# Patient Record
Sex: Male | Born: 1970 | Race: Black or African American | Hispanic: No | Marital: Single | Smoking: Never smoker
Health system: Southern US, Community
[De-identification: ages and names within clinical notes are randomized; demographics above are authoritative.]

## PROBLEM LIST (undated history)

## (undated) HISTORY — PX: OTHER SURGICAL HISTORY: SHX169

---

## 2001-03-20 ENCOUNTER — Emergency Department (HOSPITAL_COMMUNITY): Admission: EM | Admit: 2001-03-20 | Discharge: 2001-03-20 | Payer: Self-pay | Admitting: Emergency Medicine

## 2003-12-28 ENCOUNTER — Emergency Department (HOSPITAL_COMMUNITY): Admission: EM | Admit: 2003-12-28 | Discharge: 2003-12-28 | Payer: Self-pay

## 2006-01-30 ENCOUNTER — Emergency Department (HOSPITAL_COMMUNITY): Admission: EM | Admit: 2006-01-30 | Discharge: 2006-01-30 | Payer: Self-pay | Admitting: Emergency Medicine

## 2006-03-10 ENCOUNTER — Emergency Department (HOSPITAL_COMMUNITY): Admission: EM | Admit: 2006-03-10 | Discharge: 2006-03-10 | Payer: Self-pay | Admitting: Emergency Medicine

## 2006-10-08 ENCOUNTER — Emergency Department (HOSPITAL_COMMUNITY): Admission: EM | Admit: 2006-10-08 | Discharge: 2006-10-08 | Payer: Self-pay | Admitting: Emergency Medicine

## 2007-09-22 ENCOUNTER — Emergency Department (HOSPITAL_COMMUNITY): Admission: EM | Admit: 2007-09-22 | Discharge: 2007-09-22 | Payer: Self-pay | Admitting: Emergency Medicine

## 2007-11-27 ENCOUNTER — Emergency Department (HOSPITAL_COMMUNITY): Admission: EM | Admit: 2007-11-27 | Discharge: 2007-11-27 | Payer: Self-pay | Admitting: Emergency Medicine

## 2009-03-27 ENCOUNTER — Emergency Department (HOSPITAL_COMMUNITY): Admission: EM | Admit: 2009-03-27 | Discharge: 2009-03-27 | Payer: Self-pay | Admitting: Emergency Medicine

## 2009-07-19 ENCOUNTER — Emergency Department (HOSPITAL_COMMUNITY): Admission: EM | Admit: 2009-07-19 | Discharge: 2009-07-19 | Payer: Self-pay | Admitting: Emergency Medicine

## 2009-09-25 DIAGNOSIS — J869 Pyothorax without fistula: Secondary | ICD-10-CM

## 2009-09-25 HISTORY — PX: EMPYEMA DRAINAGE: SHX5097

## 2009-09-25 HISTORY — DX: Pyothorax without fistula: J86.9

## 2009-09-26 ENCOUNTER — Emergency Department (HOSPITAL_COMMUNITY): Admission: EM | Admit: 2009-09-26 | Discharge: 2009-09-26 | Payer: Self-pay | Admitting: Emergency Medicine

## 2009-09-27 ENCOUNTER — Ambulatory Visit: Payer: Self-pay | Admitting: Thoracic Surgery

## 2009-09-27 ENCOUNTER — Ambulatory Visit: Payer: Self-pay | Admitting: Pulmonary Disease

## 2009-09-27 ENCOUNTER — Inpatient Hospital Stay (HOSPITAL_COMMUNITY): Admission: EM | Admit: 2009-09-27 | Discharge: 2009-10-05 | Payer: Self-pay | Admitting: Emergency Medicine

## 2009-09-28 ENCOUNTER — Encounter: Payer: Self-pay | Admitting: Thoracic Surgery

## 2009-10-13 ENCOUNTER — Encounter: Admission: RE | Admit: 2009-10-13 | Discharge: 2009-10-13 | Payer: Self-pay | Admitting: Thoracic Surgery

## 2009-10-13 ENCOUNTER — Ambulatory Visit: Payer: Self-pay | Admitting: Thoracic Surgery

## 2009-10-14 ENCOUNTER — Encounter: Payer: Self-pay | Admitting: Physician Assistant

## 2009-10-14 ENCOUNTER — Ambulatory Visit: Payer: Self-pay | Admitting: Internal Medicine

## 2009-10-14 DIAGNOSIS — K029 Dental caries, unspecified: Secondary | ICD-10-CM | POA: Insufficient documentation

## 2009-10-14 DIAGNOSIS — J869 Pyothorax without fistula: Secondary | ICD-10-CM | POA: Insufficient documentation

## 2009-10-15 ENCOUNTER — Encounter: Payer: Self-pay | Admitting: Physician Assistant

## 2009-10-15 LAB — CONVERTED CEMR LAB
ALT: 19 units/L (ref 0–53)
AST: 18 units/L (ref 0–37)
Alkaline Phosphatase: 71 units/L (ref 39–117)
Basophils Relative: 0 % (ref 0–1)
CO2: 29 meq/L (ref 19–32)
Creatinine, Ser: 1.04 mg/dL (ref 0.40–1.50)
Eosinophils Absolute: 0.1 10*3/uL (ref 0.0–0.7)
Lymphs Abs: 3 10*3/uL (ref 0.7–4.0)
MCHC: 32.4 g/dL (ref 30.0–36.0)
MCV: 89.7 fL (ref 78.0–100.0)
Monocytes Relative: 6 % (ref 3–12)
Neutro Abs: 1.8 10*3/uL (ref 1.7–7.7)
Neutrophils Relative %: 34 % — ABNORMAL LOW (ref 43–77)
Platelets: 435 10*3/uL — ABNORMAL HIGH (ref 150–400)
RBC: 4.27 M/uL (ref 4.22–5.81)
Sodium: 140 meq/L (ref 135–145)
Total Bilirubin: 0.3 mg/dL (ref 0.3–1.2)
Total Protein: 6.9 g/dL (ref 6.0–8.3)
WBC: 5.2 10*3/uL (ref 4.0–10.5)

## 2009-11-16 ENCOUNTER — Encounter: Admission: RE | Admit: 2009-11-16 | Discharge: 2009-11-16 | Payer: Self-pay | Admitting: Thoracic Surgery

## 2009-11-16 ENCOUNTER — Ambulatory Visit: Payer: Self-pay | Admitting: Thoracic Surgery

## 2010-10-23 ENCOUNTER — Encounter: Payer: Self-pay | Admitting: Thoracic Surgery

## 2010-11-01 NOTE — Assessment & Plan Note (Signed)
Summary: XFU-EMPYEMA//DS   Vital Signs:  Patient profile:   40 year old male Height:      62 inches Weight:      156 pounds BMI:     28.64 Temp:     97.7 degrees F oral Pulse rate:   80 / minute Pulse rhythm:   regular Resp:     18 per minute BP sitting:   109 / 74  (left arm) Cuff size:   regular  Vitals Entered By: [place] Shannon (October 14, 2009 2:56 PM) CC: xfu...Marland KitchenMarland Kitchen pt has lung provider...Marland KitchenMarland Kitchen pt is concerned about back teeth... Is Patient Diabetic? No Pain Assessment Patient in pain? no       Does patient need assistance? Functional Status Self care Ambulation Normal   CC:  xfu...Marland KitchenMarland Kitchen pt has lung provider...Marland KitchenMarland Kitchen pt is concerned about back teeth....  History of Present Illness: New patient. Lived in [place] for a while and moved back to [place].   Notes he went to [place] in late 90s. Recently admitted to [facility] with left empyema.  He underwent drainage by Dr. Edwyna Shell and was d/c on 10/04/2009.  He f/u with Dr. Edwyna Shell yest and was taken off antibx.  All cultures in the hospital were neg.  He had HIV Ab reactive, but confirmatory tests were negative. UDS in hosp. clean. Feeling better.  NO chest pain, shortness of breath, fevers or chills.  Denies cough. Has a couple of teeth on bottom bilat that are bothersome.  Has not seen a dentist in a while.  Habits & Providers  Alcohol-Tobacco-Diet     Tobacco Status: never  Exercise-Depression-Behavior     Drug Use: no  Current Medications (verified): 1)  None  Allergies (verified): No Known Drug Allergies  Past History:  Past Medical History: Empyema 09/2009   a.  s/p drainage and decortication  Past Surgical History: s/p empyema drainage and decortication  Family History: unremarkable  Social History: Occupation: unemployed; previously sold cars Single 1 son Never Smoked Alcohol use-no Drug use-no Occupation:  employed Smoking Status:  never Drug Use:  no  Review of Systems  The patient denies chest pain,  syncope, melena, hematochezia, severe indigestion/heartburn, and hematuria.    Physical Exam  General:  alert, well-developed, and well-nourished.   Head:  normocephalic and atraumatic.   Eyes:  pupils equal, pupils round, and pupils reactive to light.   Ears:  R ear normal and L ear normal.   Nose:  no external deformity.   Mouth:  poor dentition.   Neck:  supple and no thyromegaly.   Lungs:  decreased breath sounds on the left no rales no egophony no wheezes  Heart:  normal rate, regular rhythm, and no murmur.   Abdomen:  soft, non-tender, and normal bowel sounds.   Msk:  normal ROM.   Neurologic:  alert & oriented X3 and cranial nerves II-XII intact.   Psych:  normally interactive.     Impression & Recommendations:  Problem # 1:  EMPYEMA (ICD-510.9) recovering well f/u with Dr. Edwyna Shell as directed  Problem # 2:  PREVENTIVE HEALTH CARE (ICD-V70.0)  check baseline labs UDS and HIV done in hosp  Orders: T-Comprehensive Metabolic Panel [phone]) T-CBC w/Diff (09811-91478) T-Syphilis Test (RPR) (29562-13086) T-TSH (57846-96295)  Problem # 3:  DENTAL CARIES (ICD-521.00)  refer to dental has at least one visible cavity on right lower molar  Orders: Dental Referral (Dentist)  Patient Instructions: 1)  Please schedule a follow-up appointment in 2 months with Gavinn Collard for CPE.  Appended Document: XFU-EMPYEMA//DS Patient: Ian Jenkins Note: All result statuses are Final unless otherwise noted.  Tests: (1) CBC with Diff (10010)   WBC                       5.2 K/uL                    4.0-10.5   RBC                       4.27 MIL/uL                 4.22-5.81   Hemoglobin           [L]  12.4 g/dL                   60.4-54.0   Hematocrit           [L]  38.3 %                      39.0-52.0   MCV                       89.7 fL                     78.0-100.0   MCHC                      32.4 g/dL                   98.1-19.1   RDW                       13.4 %                       11.5-15.5   Platelet Count       [H]  435 K/uL                    150-400   Granulocyte %        [L]  34 %                        43-77   Absolute Gran             1.8 K/uL                    1.7-7.7   Lymph %              [H]  57 %                        12-46   Absolute Lymph            3.0 K/uL                    0.7-4.0   Mono %                    6 %                         3-12   Absolute Mono             0.3 K/uL  0.1-1.0   Eos %                     2 %                         0-5   Absolute Eos              0.1 K/uL                    0.0-0.7   Baso %                    0 %                         0-1   Absolute Baso             0.0 K/uL                    0.0-0.1   WBC Morphology       RESULT: Criteria for review not met   RBC Morphology       RESULT: Criteria for review not met   Smear Review       RESULT: Criteria for review not met  Tests: (2) Comprehensive Metabolic Panel (33295)   Sodium                    140 mEq/L                   135-145   Potassium                 4.5 mEq/L                   3.5-5.3   Chloride                  103 mEq/L                   96-112   CO2                       29 mEq/L                    19-32   Glucose                   78 mg/dL                    18-84   BUN                       11 mg/dL                    1-66   Creatinine                1.04 mg/dL                  0.40-1.50   Bilirubin, Total          0.3 mg/dL                   0.6-3.0   Alkaline Phosphatase      71 U/L                      39-117  AST/SGOT                  18 U/L                      0-37   ALT/SGPT                  19 U/L                      0-53   Total Protein             6.9 g/dL                    2.5-0.5   Albumin                   3.5 g/dL                    3.9-7.6   Calcium                   8.9 mg/dL                   7.3-41.9  Tests: (3) TSH (23280)   TSH                       0.723 uIU/mL                0.350-4.500      ***Test methodology is 3rd generation TSH***  Tests: (4) RPR Reflex to T.pallidum Ab, Total (23940)   RPR                       NON REAC                    NON REAC  Note: An exclamation mark (!) indicates a result that was not dispersed into the flowsheet. Document Creation Date: 10/15/2009 3:07 AM _______________________________________________________________________  (1) Order result status: Final Collection or observation date-time: 10/14/2009 21:18 Requested date-time: 10/14/2009 16:04 Receipt date-time: 10/14/2009 21:18 Reported date-time: 10/15/2009 03:06 Referring Physician:   Ordering Physician:  Alben Spittle [phone]) Specimen Source:  Source: Lajean Silvius Order Number: [id number] Lab site: [facility]     [address]     [place]  [place]  [address]  (2) Order result status: Final Collection or observation date-time: 10/14/2009 21:18 Requested date-time: 10/14/2009 16:04 Receipt date-time: 10/14/2009 21:18 Reported date-time: 10/15/2009 03:06 Referring Physician:   Ordering Physician:  Alben Spittle [phone]) Specimen Source:  Source: Lajean Silvius Order Number: [id number] Lab site: [facility]     [address], [address]     [place]  [place]  [address]  (3) Order result status: Final Collection or observation date-time: 10/14/2009 21:18 Requested date-time: 10/14/2009 16:04 Receipt date-time: 10/14/2009 21:18 Reported date-time: 10/15/2009 03:06 Referring Physician:   Ordering Physician:  Alben Spittle [phone]) Specimen Source:  Source: Lajean Silvius Order Number: [id number] Lab site: [facility]     [address]     [place]  [place]  [address]  (4) Order result status: Final Collection or observation date-time: 10/14/2009 21:18 Requested date-time: 10/14/2009 16:04 Receipt date-time: 10/14/2009 21:18 Reported date-time: 10/15/2009 03:06 Referring Physician:     Ordering Physician:  Alben Spittle [phone]) Specimen Source:  Source: Lajean Silvius Order Number: [id number] Lab site: [facility]  [address]     [place]  [place]  [address]   Signed by Tereso Newcomer PA-C on 10/15/2009 at 2:49 PM  ________________________________________________________________________ [facility] since d/c from hosp all stable    Signed by Tereso Newcomer PA-C on 10/15/2009 at 2:49 PM

## 2010-11-01 NOTE — Letter (Signed)
Summary: *HSN Results Follow up  [facility]  [address] [place], [place] [address]   Phone: [phone]  Fax: [phone]      10/15/2009   NOAL ABSHIER [address] [place], [place]  [address]   Dear  Mr. MUADH CREASY,                            ____S.Drinkard,FNP   ____D. Gore,FNP       ____B. McPherson,MD   ____V. Rankins,MD    ____E. Mulberry,MD    ____N. Daphine Deutscher, FNP  ____D. Reche Dixon, MD    ____K. Philipp Deputy, MD    __x__S. Alben Spittle, PA-C     This letter is to inform you that your recent test(s):  _______Pap Smear    ____x___Lab Test     _______X-ray    ___x____ is within acceptable limits  _______ requires a medication change  _______ requires a follow-up lab visit  _______ requires a follow-up visit with your provider   Comments:       _________________________________________________________ If you have any questions, please contact our office                     Sincerely,  Tereso Newcomer PA-C [facility]

## 2010-11-12 ENCOUNTER — Observation Stay (HOSPITAL_COMMUNITY)
Admission: EM | Admit: 2010-11-12 | Discharge: 2010-11-15 | Disposition: A | Discharge status: Home / Self Care | Payer: Self-pay | Attending: Internal Medicine | Admitting: Internal Medicine

## 2010-11-12 ENCOUNTER — Emergency Department (HOSPITAL_COMMUNITY): Payer: Self-pay

## 2010-11-12 DIAGNOSIS — K036 Deposits [accretions] on teeth: Secondary | ICD-10-CM | POA: Insufficient documentation

## 2010-11-12 DIAGNOSIS — Z7901 Long term (current) use of anticoagulants: Secondary | ICD-10-CM | POA: Insufficient documentation

## 2010-11-12 DIAGNOSIS — K045 Chronic apical periodontitis: Secondary | ICD-10-CM | POA: Insufficient documentation

## 2010-11-12 DIAGNOSIS — M279 Disease of jaws, unspecified: Principal | ICD-10-CM | POA: Insufficient documentation

## 2010-11-12 DIAGNOSIS — K089 Disorder of teeth and supporting structures, unspecified: Secondary | ICD-10-CM | POA: Insufficient documentation

## 2010-11-12 LAB — CBC
HCT: 41.5 % (ref 39.0–52.0)
MCH: 30.7 pg (ref 26.0–34.0)
MCV: 85.6 fL (ref 78.0–100.0)
RDW: 12.1 % (ref 11.5–15.5)
WBC: 5.5 10*3/uL (ref 4.0–10.5)

## 2010-11-12 LAB — DIFFERENTIAL
Eosinophils Absolute: 0 10*3/uL (ref 0.0–0.7)
Eosinophils Relative: 1 % (ref 0–5)
Lymphocytes Relative: 55 % — ABNORMAL HIGH (ref 12–46)
Lymphs Abs: 3 10*3/uL (ref 0.7–4.0)
Monocytes Absolute: 0.3 10*3/uL (ref 0.1–1.0)
Monocytes Relative: 6 % (ref 3–12)

## 2010-11-12 LAB — COMPREHENSIVE METABOLIC PANEL
ALT: 14 U/L (ref 0–53)
Alkaline Phosphatase: 59 U/L (ref 39–117)
BUN: 11 mg/dL (ref 6–23)
CO2: 31 mEq/L (ref 19–32)
Calcium: 9.2 mg/dL (ref 8.4–10.5)
GFR calc non Af Amer: >60 mL/min (ref >60)
Glucose, Bld: 77 mg/dL (ref 70–99)
Sodium: 138 mEq/L (ref 135–145)

## 2010-11-12 LAB — POCT I-STAT, CHEM 8
BUN: 14 mg/dL (ref 6–23)
Calcium, Ion: 1.21 mmol/L (ref 1.12–1.32)
Chloride: 102 mEq/L (ref 96–112)
Creatinine, Ser: 1.3 mg/dL (ref 0.4–1.5)
Glucose, Bld: 76 mg/dL (ref 70–99)
HCT: 47 % (ref 39.0–52.0)
Potassium: 4.1 mEq/L (ref 3.5–5.1)

## 2010-11-12 LAB — APTT: aPTT: 30 seconds (ref 24–37)

## 2010-11-12 LAB — PROTIME-INR: Prothrombin Time: 13.6 seconds (ref 11.6–15.2)

## 2010-11-12 MED ORDER — IOHEXOL 300 MG/ML  SOLN
100.0000 mL | Freq: Once | INTRAMUSCULAR | Status: AC | PRN
  Administered 2010-11-12: 100 mL via INTRAVENOUS

## 2010-11-13 LAB — COMPREHENSIVE METABOLIC PANEL
ALT: 13 U/L (ref 0–53)
BUN: 10 mg/dL (ref 6–23)
CO2: 29 mEq/L (ref 19–32)
Calcium: 9.2 mg/dL (ref 8.4–10.5)
Creatinine, Ser: 1.12 mg/dL (ref 0.4–1.5)
GFR calc non Af Amer: >60 mL/min (ref >60)
Glucose, Bld: 79 mg/dL (ref 70–99)

## 2010-11-13 LAB — DIFFERENTIAL
Lymphs Abs: 3.1 10*3/uL (ref 0.7–4.0)
Monocytes Absolute: 0.4 10*3/uL (ref 0.1–1.0)
Monocytes Relative: 7 % (ref 3–12)
Neutro Abs: 1.6 10*3/uL — ABNORMAL LOW (ref 1.7–7.7)
Neutrophils Relative %: 31 % — ABNORMAL LOW (ref 43–77)

## 2010-11-13 LAB — CBC
Hemoglobin: 14.6 g/dL (ref 13.0–17.0)
MCH: 30.4 pg (ref 26.0–34.0)
MCV: 85.6 fL (ref 78.0–100.0)
RBC: 4.8 MIL/uL (ref 4.22–5.81)

## 2010-11-13 LAB — PHOSPHORUS: Phosphorus: 4 mg/dL (ref 2.3–4.6)

## 2010-11-13 LAB — MAGNESIUM: Magnesium: 2 mg/dL (ref 1.5–2.5)

## 2010-11-14 ENCOUNTER — Observation Stay (HOSPITAL_COMMUNITY): Payer: Self-pay

## 2010-11-15 ENCOUNTER — Other Ambulatory Visit (HOSPITAL_COMMUNITY): Payer: Self-pay | Admitting: Dentistry

## 2010-11-15 DIAGNOSIS — K036 Deposits [accretions] on teeth: Secondary | ICD-10-CM

## 2010-11-15 DIAGNOSIS — K0530 Chronic periodontitis, unspecified: Secondary | ICD-10-CM

## 2010-11-15 DIAGNOSIS — R220 Localized swelling, mass and lump, head: Secondary | ICD-10-CM

## 2010-11-15 DIAGNOSIS — R221 Localized swelling, mass and lump, neck: Secondary | ICD-10-CM

## 2010-11-15 DIAGNOSIS — K045 Chronic apical periodontitis: Secondary | ICD-10-CM

## 2010-11-16 NOTE — Op Note (Signed)
NAMELAMAJ, METOYER               ACCOUNT NO.:  [id number]  MEDICAL RECORD NO.:  [id number]           PATIENT TYPE:  I  LOCATION:  1521                         FACILITY:  [facility]  PHYSICIAN:  Charlynne Pander, D.D.S.DATE OF BIRTH:  1971/09/22  DATE OF PROCEDURE:   11/15/2010 DATE OF DISCHARGE: 11/15/2010                              OPERATIVE REPORT   PREOPERATIVE DIAGNOSES: 1. Left submandibular swelling. 2. Apical periodontitis. 3. Chronic periodontitis. 4. Tooth mobility.  POSTOPERATIVE DIAGNOSES: 1. Left submandibular swelling. 2. Apical periodontitis. 3. Chronic periodontitis. 4. Tooth mobility.  OPERATION: 1. Extraction of tooth numbers 18 and 31 with alveoloplasty. 2. Biopsy of soft tissue in the area extraction sites numbers 18 and     31 with submission to Pathology.  SURGEON:  Charlynne Pander, D.D.S.  ASSISTANT:  Public house manager Event organiser).  ANESTHESIA:  Local anesthesia only.  MEDICATIONS: 1. Unasyn IV antibiotic therapy as per previous orders. 2. Local anesthesia with total utilization of three carpules each     containing 36 mg of Xylocaine and 0.018 mg of epinephrine.  SPECIMENS: 1. Tooth and soft tissue associated with tooth #18. 2. Tooth and soft tissue associated tooth number 31    (pathology pending)  DRAINS:  None.  CULTURES:  None.  COMPLICATIONS:  None.  ESTIMATED BLOOD LOSS:  Minimal.  FLUIDS:  None.  INDICATIONS:  The patient was recently admitted with history of left facial and submandibular swelling.  Dental consultation requested to evaluate dental etiology and provide treatment as indicated.  The patient is examined and treatment planned for extraction of tooth numbers 18 and 31 with alveoloplasty as well as submission of soft tissue associated with the areas of numbers 18 and 31.  This treatment plan was formulated to decrease the risk and complication of dental infection from further at affecting the patient's  systemic health.  OPERATIVE FINDINGS:  The patient was examined in the Dental Medicine Clinic.  Tooth numbers 18 and 31 were identified for extraction.  The patient noted be affected by history of left submandibular swelling, periapical pathology in the area of numbers 18 and 31 as well as tooth mobility and chronic periodontitis associated with these teeth.  DESCRIPTION OF PROCEDURE:  The patient presented to the Dental Medicine Clinic.  All risks, benefits, and complications of the various procedures were explained to the patient.  The patient agreed to proceed with extraction of tooth numbers 18 and 31 with alveoloplasty as indicated along with submission of soft tissue areas numbers 18 and 31 for pathology evaluation.  An operative permit was signed and witnessed. The patient was then prepped and draped in usual manner for dental medicine procedure.  A time-out was performed.  The patient was identified and procedures were verified.  At this point of time, local anesthesia was administered with a total utilization of three carpules each containing 36 mg of Xylocaine with 0.018 mg of epinephrine.  The mandibular left and right quadrants were approached.  The patient was given bilateral inferior alveolar nerve blocks and long buccal nerve blocks utilizing the Xylocaine with epinephrine.  Further infiltration was then achieved utilizing lidocaine  with epinephrine.  This point in time, tooth #18 was approached.  The tooth was subluxated with a series straight elevators.  Tooth #18 was then removed with a 23 forceps without complication.  The extraction socket was then curetted and soft tissue along with tooth #18 was submitted to pathology to further evaluate the area for other pathology.  At this point in time, further alveoloplasty was then performed utilizing rongeurs and bone file.  The surgical site was then irrigated with copious amounts of sterile saline. The surgical site was  then closed from the mesial of #17 extended to the distal #19 utilizing 3-0 chromic gut suture in a continuous interrupted suture technique x1.  At this point in time, the mandibular right quadrant was approached. Tooth number 31 was then removed and 23 forceps without complication. Tooth #31 and soft tissue in the area of #31 was then submitted to Pathology for further evaluation.  The socket was then curetted and compressed appropriately.  Alveoplasty was then performed utilizing rongeurs and bone file.  Surgical site was then irrigated with copious amounts of sterile saline.  The surgical site was then closed from the mesial of #32 and extended the distal #30 utilizing 3-0 chromic gut suture in a continuous interrupted suture technique x1.  A series of 4x4 gauze were placed in the mouth to aid hemostasis.  The patient was then placed in the upright position.  Postoperative vitals were obtained and recorded appropriately.  The patient was then provided postop instructions written and verbally.  The patient will utilize pain medication of Percocet 5/325 1-2 tablets every 4-6 hours as needed for pain.  The patient will return approximately in a week for evaluation for suture removal.  Ultimate disposition of the patient as far as discharge will be determined by the hospitalist at this time. All counts were correct for dental medicine procedure.  The patient was then dismissed to the care of the hospital attendant caregiver for 5100.     Charlynne Pander, D.D.S.     RFK/MEDQ  D:  11/15/2010  T:  11/15/2010  Job:  [id number]  cc:   Kathlen Mody, MD  Electronically Signed by Cindra Eves D.D.S. on 11/16/2010 09:01:14 AM

## 2010-11-16 NOTE — Consult Note (Signed)
NAMEFRAN, MCREE               ACCOUNT NO.:  [id number]  MEDICAL RECORD NO.:  [id number]           PATIENT TYPE:  I  LOCATION:  1521                         FACILITY:  [facility]  PHYSICIAN:  Charlynne Pander, D.D.S.DATE OF BIRTH:  Sep 02, 1971  DATE OF CONSULTATION:  11/15/2010 DATE OF DISCHARGE: 11/15/2010                                CONSULTATION   REFERRING PHYSICIAN:  Kathlen Mody, MD  HISTORY:  Ian Jenkins is a 40 year old male, referred by Dr. Blake Divine for dental consultation.  The patient was recently admitted with a history of left facial swelling.  Dental consultation requested to evaluate dental etiology and to provide dental treatment as indicated.  PAST MEDICAL HISTORY: 1. History of left jaw swelling, reason for this admission.     a.     Current IV antibiotic therapy with Unasyn. 2. History of empyema in December of 2010.     a.     Status post VATS procedure with mini thoracotomy,      decortication and drainage of the empyema by Dr. Karle Plumber on      September 28, 2009.  ALLERGIES:  SCALLOPS.  MEDICATIONS: 1. Unasyn IV every 6 hours. 2. Lovenox 40 mg subcutaneously every 24 hours.  SOCIAL HISTORY:  The patient works in [employer] se[employer].  The patient previously was a Community education officer.  The patient is a nonsmoker, nondrinker, and does not use IV drugs.  FAMILY HISTORY:  Father died at the age of 108 from heart attack and had a history of coronary artery disease, congestive heart failure.  Mother is otherwise healthy.  FUNCTION ASSESSMENT:  The patient was independent for ADLs prior to this admission.  REVIEW OF SYSTEMS:  Reviewed from the chart for this admission.  DENTAL HISTORY:  CHIEF COMPLAINT:  Dental consultation requested to evaluate the patient for history of left facial swelling.  HISTORY OF PRESENT ILLNESS:  The patient gives a history of having left jaw and facial swelling for approximately 1 month.  The patient indicates that the  swelling worsened to the point where he needed to seek evaluation at the emergency room.  The patient was subsequently admitted, placed on IV antibiotic therapy.  The patient indicates that the area hurts somewhat and is "sore."  The patient indicates that when it hurts, it hurts at intensity of 2-3/10.  The patient indicates it is approximately 1-2/10 today.  The patient indicates that the IV antibiotic therapy has helped with some of the swelling, but has not removed all swelling.  The patient points to tooth #18 and 31 as the offending teeth that hurt for him from time to time.  The patient last saw a dentist "a long time ago."  The patient indicates that he was in his "teens."  The patient has not sought regular dental care since his childhood and does not have a regular primary dentist.  PHYSICAL EXAMINATION:  GENERAL:  The patient is a well-developed, well- nourished male in no acute distress. VITAL SIGNS:  Blood pressure is 111/71, pulse rate is 64, respirations 17, temperature 99.3. HEENT:  Patient with left submandibular swelling with some slight swelling  in the right side as well.  The patient denies acute TMJ symptoms.  The patient does not appear to have any symptoms of trismus or difficulty swallowing. DENTITION:  The patient with an intact dentition. INTRAORAL EXAM:  The patient with normal saliva.  I do not see any evidence of intraoral abscess formation.  The patient does have a fistulous tract on the lingual aspect of tooth #18. DENTAL CARIES:  The patient has several areas of dental caries.  The patient would need a full series of dental radiographs to identify the extent of the dental caries. ENDODONTIC:  Patient with multiple areas of periapical pathology and evidence of a fistulous tract on the mandibular lingual area #18.  The patient has not had any previous root canal therapies. CROWN OR BRIDGE:  There are no crown or bridge restorations. PROSTHODONTIC:  No  history of partial dentures at this time. OCCLUSION:  The patient with a stable occlusion, but a poor occlusal scheme.  RADIOGRAPHIC INTERPRETATION:  Panoramic x-ray was taken on November 14, 2010, by the department of radiology.  There are no missing teeth.  The patient has all remaining wisdom teeth present.  The patient has periapical pathology associated with apices of tooth #18 and #31 at this time.  The patient appears to have several areas of dental caries.  I would need a full series of dental radiographs to identify the extent of the dental caries.  ASSESSMENT: 1. Left submandibular swelling. 2. Periapical pathology at the apices of tooth #18 and #31. 3. Chronic periodontitis with bone loss. 4. Plaque and calculus accumulations. 5. Tooth mobility. 6. History of oral neglect. 7. Stable occlusion, but a poor occlusal scheme. 8. Current Lovenox therapy with risk for bleeding with invasive dental     procedures.  PLAN/RECOMMENDATIONS:   1. I discussed the risks, benefits, complications, and various treatment options with the patient in relationship to his medical and dental conditions.  We discussed various treatment options to include no treatment, selective extraction of tooth #18 and #31 at this time with alveoplasty as indicated, submission of periapical contents to Pathology to rule out other pathology, need for followup with a primary dentist of his choice for an exam, x-rays, and treatment planning most likely to include additional extractions of wisdom teeth, periodontal therapy, dental restorations, possible root canal therapy, crown or bridge therapy, implant therapy, and replacing missing teeth as indicated after adequate healing.  The patient agrees to proceed with selective extraction of tooth #18 and #31 at this time in the hospital  dental clinic today at 10:30 a.m.  The patient will then follow up with  a dentist of his choice for additional evaluation and  care as indicated.  2. Discussion of findings with Dr. Blake Divine and assistance in coordination of future care as indicated.     Charlynne Pander, D.D.S.     RFK/MEDQ  D:  11/15/2010  T:  11/15/2010  Job:  [id number]  cc:   Kathlen Mody, MD  Electronically Signed by Cindra Eves D.D.S. on 11/16/2010 09:02:44 AM

## 2010-11-22 NOTE — H&P (Signed)
NAMEJHALEN, ELEY               ACCOUNT NO.:  [id number]  MEDICAL RECORD NO.:  [id number]           PATIENT TYPE:  E  LOCATION:  WLED                         FACILITY:  [facility]  PHYSICIAN:  Kathlen Mody, MD       DATE OF BIRTH:  1970/11/05  DATE OF ADMISSION:  11/12/2010 DATE OF DISCHARGE:                             HISTORY & PHYSICAL   CHIEF COMPLAINT:  Left jaw swelling since 1 month.  HISTORY OF PRESENT ILLNESS:  Mr. Elting is a 40 year old gentleman who has a past medical history of empyema, status post VATS decortication came in complaining of left jaw swelling and mild soreness since 1 month.  The patient denies any fever or chills.  Denies shortness of breath, chest pain, syncope.  The patient denies any hoarseness of voice.  The patient denies any nausea, vomiting, abdominal pain, diarrhea.  Denies any urinary complaints.  Denies headache, blurry vision, tingling or numbness.  The patient also denies any oozing of blood from the mouth, etc.  REVIEW OF SYSTEMS:  See HPI, otherwise negative.  PAST MEDICAL HISTORY:  Empyema of the left chest, status post VATS.  FAMILY HISTORY:  Coronary artery disease in the father.  SOCIAL HISTORY:  The patient works in the financial services.  Denies smoking, EtOH or IV drug abuse.  MEDICATIONS:  None.  ALLERGIES:  No known drug allergies.  PHYSICAL EXAMINATION:  VITAL SIGNS:  Temperature of 98.2, pulse of 79 per minute, respiratory rate 20 per minute, blood pressure 141/82, saturating about 97% on room air.  GENERAL:  The patient is alert, afebrile, oriented x3, comfortable in no acute distress.  HEENT AND NECK:  Pupils equal and reactive to light.  Moist mucous membranes.  No deviation of the uvula.  No JVD.  CARDIOVASCULAR:  S1, S2 heard.  No rubs, murmurs or gallops.  Regular rate and rhythm.  RESPIRATORY:  Good air entry bilateral.  ABDOMEN:  Soft, nontender, nondistended.  Good bowel sounds.  EXTREMITIES:  No pedal edema.   NEUROLOGIC:  Nonfocal.  PERTINENT LABORATORY AND X-RAY DATA:  The patient had a CBC which showed a WBC count of 5.5, hemoglobin of 14.9, hematocrit of 41.5, platelets of 239.  Sodium of 139, potassium of 4.1, chloride 102, bicarb 28, glucose 76, BUN 14, creatinine 1.3.  The patient had a CT maxillofacial with contrast showing 3 x 3 x 2 cm left submandibular abscess superficially, extensive periapical disease involving the mandibular wisdom teeth bilaterally with destructive bone changes.  This likely communicates with the superficial abscess on the left but there is no direct connection.  ASSESSMENT AND PLAN: 1. This 40 year old gentleman came in for left jaw swelling.  He was     found to have a left submandibular abscess.  The patient is     afebrile.  No leukocytosis.  Oral surgery not available over the     weekend.  The patient will be started on IV Unasyn 3 g q.6 h.  At     this time, the patient was not complaining of any shortness of     breath, so will monitor him.  Pain medications as needed.  Will     consult Oral Surgery on Monday morning and get their     recommendations. 2. Regular diet. 3. The patient is ambulatory. 4. The patient is Full Code.          ______________________________ Kathlen Mody, MD     VA/MEDQ  D:  11/12/2010  T:  11/12/2010  Job:  [id number]  Electronically Signed by Kathlen Mody MD on 11/22/2010 10:33:18 PM

## 2010-11-24 ENCOUNTER — Ambulatory Visit (HOSPITAL_COMMUNITY): Payer: Self-pay | Admitting: Dentistry

## 2010-11-24 DIAGNOSIS — K08109 Complete loss of teeth, unspecified cause, unspecified class: Secondary | ICD-10-CM

## 2010-11-24 DIAGNOSIS — K08409 Partial loss of teeth, unspecified cause, unspecified class: Secondary | ICD-10-CM

## 2010-11-25 NOTE — Discharge Summary (Signed)
  Ian Jenkins, Ian Jenkins               ACCOUNT NO.:  [id number]  MEDICAL RECORD NO.:  [id number]           PATIENT TYPE:  I  LOCATION:  1521                         FAC[facility]  PHYSICIAN:  Kathlen Mody, MD       DATE OF BIRTH:  03-30-1971  DATE OF ADMISSION:  11/12/2010 DATE OF DISCHARGE:  11/15/2010                              DISCHARGE SUMMARY   DISCHARGE DIAGNOSES: 1. Left submandibular abscess. 2. Apical periodontitis, chronic periodontitis.  DISCHARGE MEDICATIONS: 1. Augmentin 1 tablet b.i.d. for about 7 days. 2. Percocet 1 tablet q.6-8 h. for 1 week.  PERTINENT LABORATORY DATA:  CBC was normal.  Chem-8 was normal. Magnesium was 2.1, phosphorus was 8.9.  CBC on the day of discharge showed his WBC count of 5.1, hemoglobin of 14.6, hematocrit of 41.8. CMP was within normal limits.  RADIOLOGY:  The patient had a CT maxillofacial with contrast, showed 2 to 3 cm left submandibular abscess superficially.  Extensive periapicaldisease involving the mandibular wisdom teeth bilateral with destructive bone changes, likely communicates with superficial abscess on the left. Odontogram done on October 31, 2010, showed well-defined lucency surrounding the roots of both right and left lower second molars, consider abscess versus odontogenic cyst.  CONSULTS CALLED:  Oral surgery consult/dental consult called.  PROCEDURES DONE:  The patient had extractions of tooth with alveoloplasty and biopsy of the soft tissue at the extraction site and sent to Pathology.  BRIEF HOSPITAL COURSE:  This 40 year old gentleman with past medical history of empyema, status post VATS decortication came in planning of left jaw swelling and sore throat since 1 month.  He was found to have a submandibular abscess and was started on IV Unasyn for the abscess. Oral surgery/dental consult was called.  He underwent extraction of the tooth and alveoloplasty and he was discharged on oral antibiotic, Augmentin 1  tablet twice a day for about 7 days to complete the course of antibiotics and pain medication.  PHYSICAL EXAMINATION:  GENERAL:  He was alert, afebrile, oriented x3, in no acute distress. VITAL SIGNS:  On the day of discharge, he had temperature of 97.9, pulse was 69, respiratory rate of 18 per minute, blood pressure 124/70, saturating about 97% on room air. CARDIOVASCULAR:  S1 and S2 heard. RESPIRATORY:  Good air entry bilaterally. ABDOMEN:  Soft, nontender, and nondistended.  Good bowel sounds. EXTREMITIES:  No pedal edema.  The patient was hemodynamically stable for discharge.  He was discharged on oral antibiotics and pain medication.  He was asked to follow with Dental and to follow with the pathology results.          ______________________________ Kathlen Mody, MD     VA/MEDQ  D:  11/23/2010  T:  11/24/2010  Job:  [id number]  Electronically Signed by Kathlen Mody MD on 11/25/2010 12:17:59 AM

## 2010-12-18 LAB — CBC
MCV: 90.1 fL (ref 78.0–100.0)
MCV: 90.9 fL (ref 78.0–100.0)
Platelets: 547 10*3/uL — ABNORMAL HIGH (ref 150–400)
RBC: 3.98 MIL/uL — ABNORMAL LOW (ref 4.22–5.81)
WBC: 10.2 10*3/uL (ref 4.0–10.5)
WBC: 7.6 10*3/uL (ref 4.0–10.5)

## 2010-12-18 LAB — BASIC METABOLIC PANEL
CO2: 28 mEq/L (ref 19–32)
Calcium: 9.2 mg/dL (ref 8.4–10.5)
Sodium: 134 mEq/L — ABNORMAL LOW (ref 135–145)

## 2011-01-02 LAB — POCT I-STAT 3, ART BLOOD GAS (G3+)
Bicarbonate: 28.6 mEq/L — ABNORMAL HIGH (ref 20.0–24.0)
Bicarbonate: 30.4 mEq/L — ABNORMAL HIGH (ref 20.0–24.0)
Patient temperature: 98.3
TCO2: 30 mmol/L (ref 0–100)
TCO2: 32 mmol/L (ref 0–100)
pCO2 arterial: 44.8 mmHg (ref 35.0–45.0)
pCO2 arterial: 47.7 mmHg — ABNORMAL HIGH (ref 35.0–45.0)
pH, Arterial: 7.412 (ref 7.350–7.450)
pH, Arterial: 7.413 (ref 7.350–7.450)
pO2, Arterial: 49 mmHg — ABNORMAL LOW (ref 80.0–100.0)
pO2, Arterial: 77 mmHg — ABNORMAL LOW (ref 80.0–100.0)

## 2011-01-02 LAB — DIFFERENTIAL
Eosinophils Absolute: 0 10*3/uL (ref 0.0–0.7)
Eosinophils Relative: 0 % (ref 0–5)
Lymphocytes Relative: 13 % (ref 12–46)
Lymphs Abs: 1.5 10*3/uL (ref 0.7–4.0)
Lymphs Abs: 1.7 10*3/uL (ref 0.7–4.0)
Monocytes Absolute: 1.4 10*3/uL — ABNORMAL HIGH (ref 0.1–1.0)
Monocytes Relative: 11 % (ref 3–12)
Monocytes Relative: 13 % — ABNORMAL HIGH (ref 3–12)
Neutrophils Relative %: 74 % (ref 43–77)

## 2011-01-02 LAB — FUNGUS CULTURE W SMEAR

## 2011-01-02 LAB — CBC
HCT: 30.7 % — ABNORMAL LOW (ref 39.0–52.0)
HCT: 34.2 % — ABNORMAL LOW (ref 39.0–52.0)
HCT: 37 % — ABNORMAL LOW (ref 39.0–52.0)
HCT: 37.1 % — ABNORMAL LOW (ref 39.0–52.0)
Hemoglobin: 10.8 g/dL — ABNORMAL LOW (ref 13.0–17.0)
Hemoglobin: 11.9 g/dL — ABNORMAL LOW (ref 13.0–17.0)
MCHC: 34.6 g/dL (ref 30.0–36.0)
MCV: 90 fL (ref 78.0–100.0)
MCV: 90.6 fL (ref 78.0–100.0)
MCV: 90.9 fL (ref 78.0–100.0)
Platelets: 304 10*3/uL (ref 150–400)
Platelets: 310 10*3/uL (ref 150–400)
Platelets: 317 10*3/uL (ref 150–400)
Platelets: 323 10*3/uL (ref 150–400)
Platelets: 345 10*3/uL (ref 150–400)
Platelets: 402 10*3/uL — ABNORMAL HIGH (ref 150–400)
RBC: 3.38 MIL/uL — ABNORMAL LOW (ref 4.22–5.81)
RBC: 3.57 MIL/uL — ABNORMAL LOW (ref 4.22–5.81)
RBC: 4.08 MIL/uL — ABNORMAL LOW (ref 4.22–5.81)
RBC: 4.12 MIL/uL — ABNORMAL LOW (ref 4.22–5.81)
RDW: 12.4 % (ref 11.5–15.5)
RDW: 12.6 % (ref 11.5–15.5)
RDW: 12.6 % (ref 11.5–15.5)
WBC: 11 10*3/uL — ABNORMAL HIGH (ref 4.0–10.5)
WBC: 11.4 10*3/uL — ABNORMAL HIGH (ref 4.0–10.5)
WBC: 11.9 10*3/uL — ABNORMAL HIGH (ref 4.0–10.5)
WBC: 12.2 10*3/uL — ABNORMAL HIGH (ref 4.0–10.5)
WBC: 13 10*3/uL — ABNORMAL HIGH (ref 4.0–10.5)

## 2011-01-02 LAB — BASIC METABOLIC PANEL
BUN: 12 mg/dL (ref 6–23)
BUN: 13 mg/dL (ref 6–23)
BUN: 13 mg/dL (ref 6–23)
BUN: 6 mg/dL (ref 6–23)
BUN: 8 mg/dL (ref 6–23)
Calcium: 8.6 mg/dL (ref 8.4–10.5)
Calcium: 8.6 mg/dL (ref 8.4–10.5)
Calcium: 8.7 mg/dL (ref 8.4–10.5)
Chloride: 94 mEq/L — ABNORMAL LOW (ref 96–112)
Creatinine, Ser: 0.88 mg/dL (ref 0.4–1.5)
GFR calc Af Amer: >60 mL/min (ref >60)
GFR calc Af Amer: >60 mL/min (ref >60)
GFR calc non Af Amer: 59 mL/min — ABNORMAL LOW (ref >60)
GFR calc non Af Amer: >60 mL/min (ref >60)
GFR calc non Af Amer: >60 mL/min (ref >60)
GFR calc non Af Amer: >60 mL/min (ref >60)
GFR calc non Af Amer: >60 mL/min (ref >60)
Glucose, Bld: 82 mg/dL (ref 70–99)
Glucose, Bld: 90 mg/dL (ref 70–99)
Glucose, Bld: 97 mg/dL (ref 70–99)
Potassium: 3.9 mEq/L (ref 3.5–5.1)
Potassium: 4.2 mEq/L (ref 3.5–5.1)
Potassium: 4.2 mEq/L (ref 3.5–5.1)
Sodium: 132 mEq/L — ABNORMAL LOW (ref 135–145)

## 2011-01-02 LAB — COMPREHENSIVE METABOLIC PANEL
AST: 45 U/L — ABNORMAL HIGH (ref 0–37)
Albumin: 2.1 g/dL — ABNORMAL LOW (ref 3.5–5.2)
Alkaline Phosphatase: 75 U/L (ref 39–117)
BUN: 8 mg/dL (ref 6–23)
CO2: 33 mEq/L — ABNORMAL HIGH (ref 19–32)
Chloride: 94 mEq/L — ABNORMAL LOW (ref 96–112)
Creatinine, Ser: 1.06 mg/dL (ref 0.4–1.5)
GFR calc non Af Amer: >60 mL/min (ref >60)
Potassium: 3.9 mEq/L (ref 3.5–5.1)
Total Bilirubin: 0.4 mg/dL (ref 0.3–1.2)

## 2011-01-02 LAB — URINALYSIS, ROUTINE W REFLEX MICROSCOPIC
Glucose, UA: NEGATIVE mg/dL
Leukocytes, UA: NEGATIVE
pH: 6 (ref 5.0–8.0)

## 2011-01-02 LAB — AFB CULTURE WITH SMEAR (NOT AT ARMC): Acid Fast Smear: NONE SEEN

## 2011-01-02 LAB — POCT I-STAT, CHEM 8
Calcium, Ion: 1.1 mmol/L — ABNORMAL LOW (ref 1.12–1.32)
Creatinine, Ser: 1.4 mg/dL (ref 0.4–1.5)
Glucose, Bld: 111 mg/dL — ABNORMAL HIGH (ref 70–99)
Hemoglobin: 12.9 g/dL — ABNORMAL LOW (ref 13.0–17.0)
TCO2: 28 mmol/L (ref 0–100)

## 2011-01-02 LAB — BODY FLUID CULTURE
Culture: NO GROWTH
Gram Stain: NONE SEEN

## 2011-01-02 LAB — URINE MICROSCOPIC-ADD ON

## 2011-01-02 LAB — RAPID URINE DRUG SCREEN, HOSP PERFORMED
Amphetamines: NOT DETECTED
Barbiturates: NOT DETECTED
Benzodiazepines: NOT DETECTED
Cocaine: NOT DETECTED
Opiates: NOT DETECTED

## 2011-01-02 LAB — TISSUE CULTURE

## 2011-01-02 LAB — ABO/RH: ABO/RH(D): O POS

## 2011-01-02 LAB — CROSSMATCH: ABO/RH(D): O POS

## 2011-01-02 LAB — MRSA PCR SCREENING: MRSA by PCR: NEGATIVE

## 2011-01-02 LAB — CULTURE, BLOOD (SINGLE)

## 2011-01-02 LAB — PROTIME-INR: INR: 1.18 (ref 0.00–1.49)

## 2011-01-02 LAB — CARDIAC PANEL(CRET KIN+CKTOT+MB+TROPI)
CK, MB: 1.2 ng/mL (ref 0.3–4.0)
CK, MB: 1.7 ng/mL (ref 0.3–4.0)
Relative Index: 0.7 (ref 0.0–2.5)
Relative Index: 0.8 (ref 0.0–2.5)
Total CK: 223 U/L (ref 7–232)
Total CK: 236 U/L — ABNORMAL HIGH (ref 7–232)
Troponin I: 0.04 ng/mL (ref 0.00–0.06)
Troponin I: 0.05 ng/mL (ref 0.00–0.06)

## 2011-02-14 NOTE — Assessment & Plan Note (Signed)
OFFICE VISIT   NESTER, BACHUS  DOB:  1971/03/31                                        October 13, 2009  CHART #:  [id number]   The patient returned for his first postoperative visit.  His blood  pressure was 115/68, pulse 78, respirations 18, and sats were 98%.  Chest x-ray showed normal postoperative changes and has marked decrease  in the pleural reaction on the left side.  We removed his chest tube  sutures.  His incisions are well healed.  I gradually told him to  increase his activity.  I gave him a refill for Percocet, #40.  I will  see him back again in 3 weeks with a chest x-ray.   Ines Bloomer, M.D.  Electronically Signed   DPB/MEDQ  D:  10/13/2009  T:  10/14/2009  Job:  [id number]

## 2011-02-17 NOTE — Assessment & Plan Note (Signed)
OFFICE VISIT   [name], [name]  DOB:  Feb 14, 1971                                        November 17, 2009  CHART #:  [id number]   This patient is came today for followup of his empyema.  Chest x-ray  shows a small effusion on the left side.  Blood pressure is 114/77,  pulse 100, respirations 18, sats were 98%.  His incisions are well  healed.  Lungs are clear to auscultation and percussion.  We will see  him back in 3 months with a chest x-ray for final check.   Ines Bloomer, M.D.  Electronically Signed   DPB/MEDQ  D:  11/17/2009  T:  11/18/2009  Job:  [id number]

## 2012-11-25 IMAGING — CT CT MAXILLOFACIAL W/ CM
4 of 5 series · 15 of 33 positions shown, 17 images · IV contrast (APPLIED)
Comparison: None

CLINICAL DATA: Left submandibular soft tissue swelling.

CT MAXILLOFACIAL WITH CONTRAST
TECHNIQUE: Multidetector CT imaging of the maxillofacial
structures was performed with intravenous contrast. Multiplanar CT
image reconstructions were also generated.
Contrast: 100 ml Jmnipaque-AWW

[Series 5: neck_routine 3.0 b40s st · axial · 0.35mm/px · z∈[+1128,+1220]mm · 3 of 63 slices shown]
[im 16/63  bone]
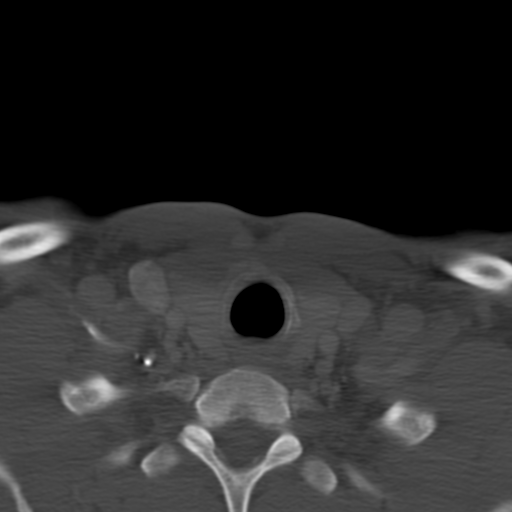
[im 32/63  bone]
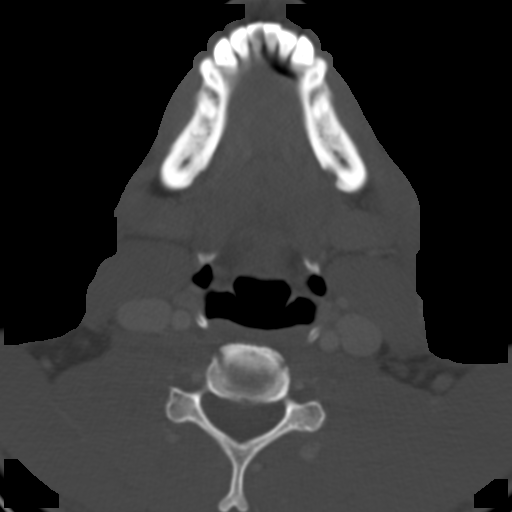
[im 47/63  bone]
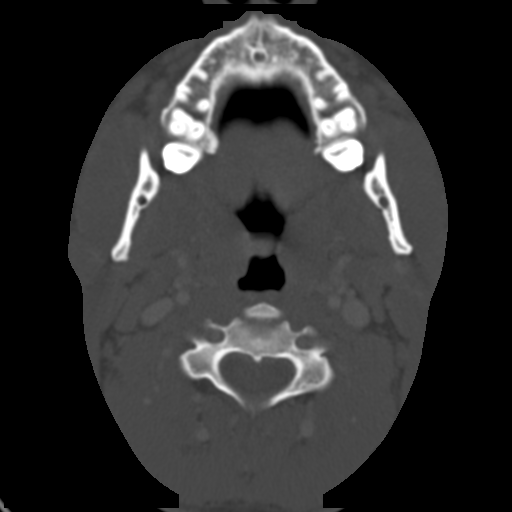

[Series 602: axial · axial · 0.37mm/px · z∈[+1084,+1216]mm · 6 of 95 slices shown, 8 images]
[im 14/95  soft-tissue]
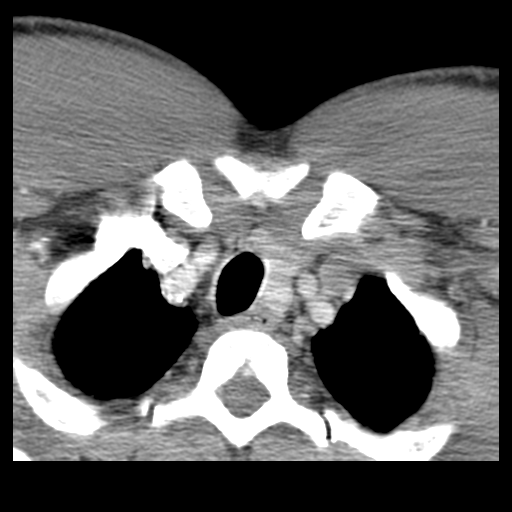
[im 14/95  bone]
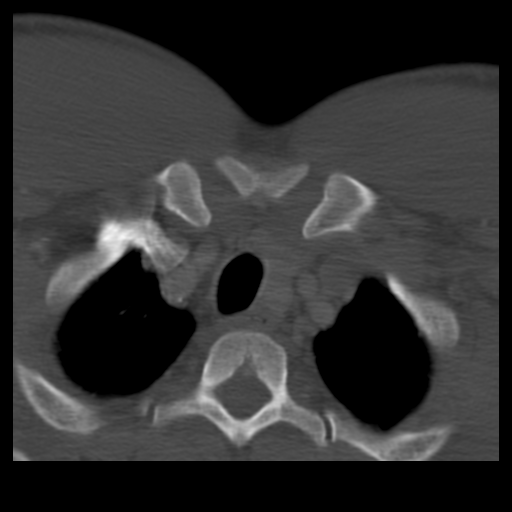
[im 27/95  bone]
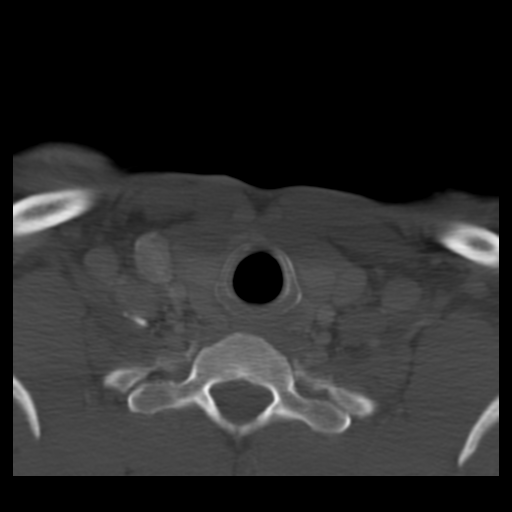
[im 41/95  bone]
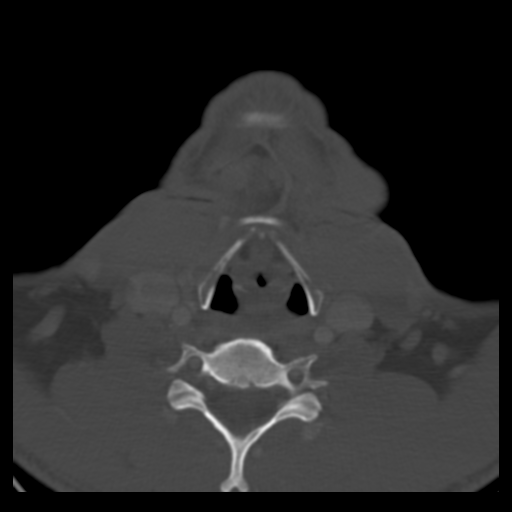
[im 54/95  bone]
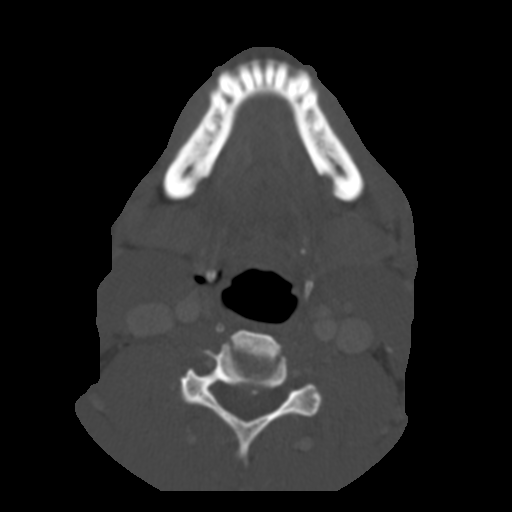
[im 68/95  soft-tissue]
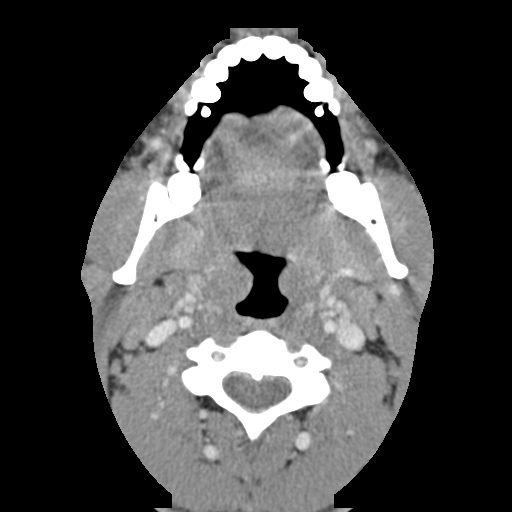
[im 68/95  bone]
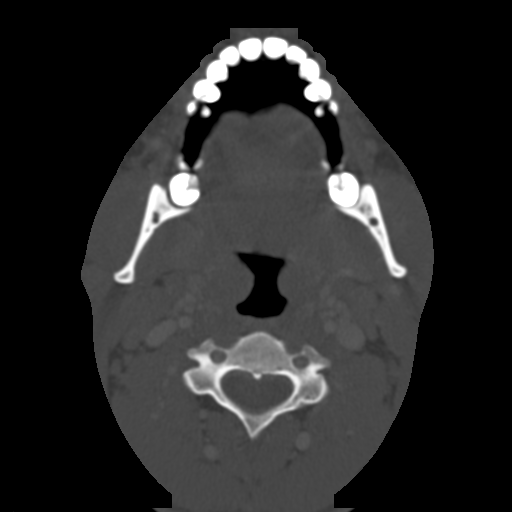
[im 81/95  bone]
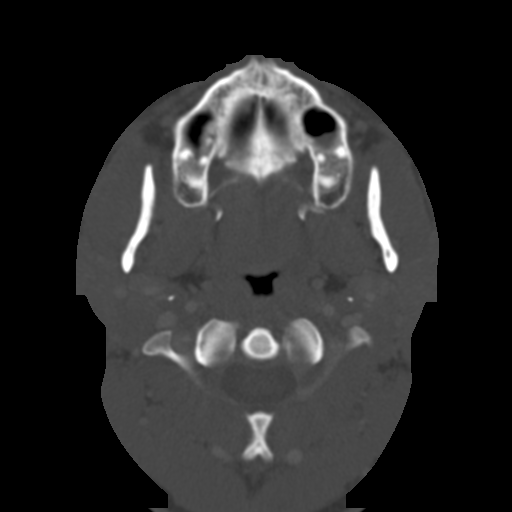

[Series 603: coronal · coronal · 0.37mm/px · 3 of 87 slices shown]
[im 18/87  bone]
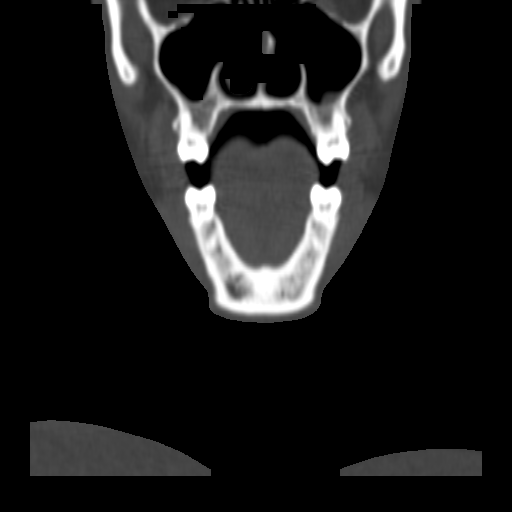
[im 35/87  bone]
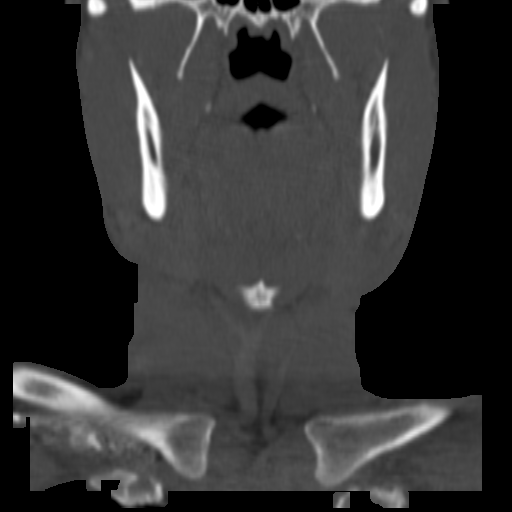
[im 52/87  bone]
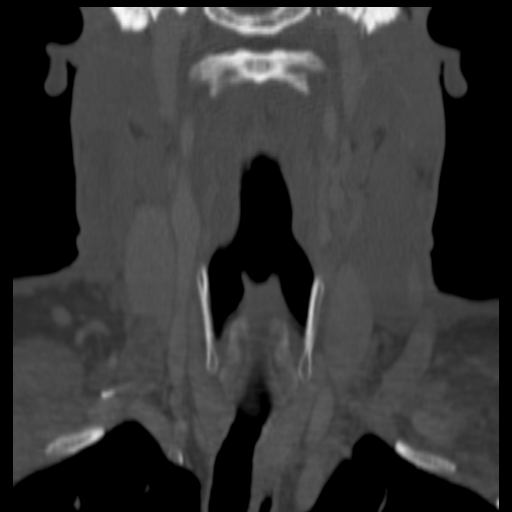

[Series 604: sagittal · sagittal · 0.37mm/px · 3 of 78 slices shown]
[im 33/78  bone]
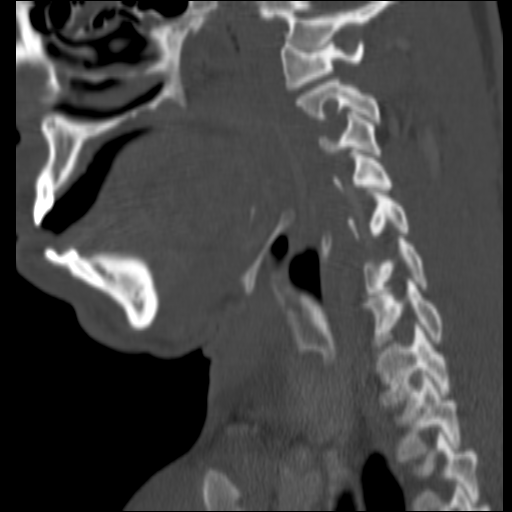
[im 39/78  bone]
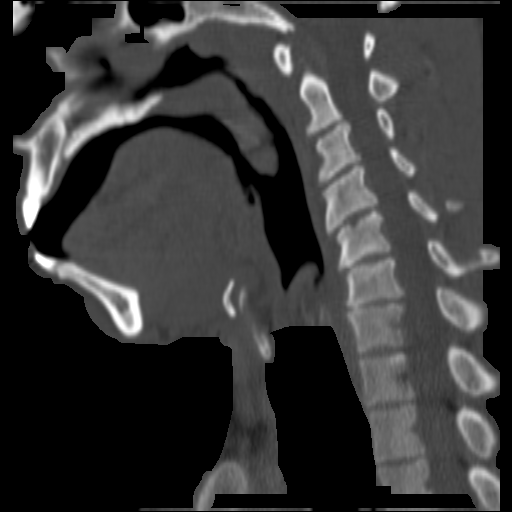
[im 45/78  bone]
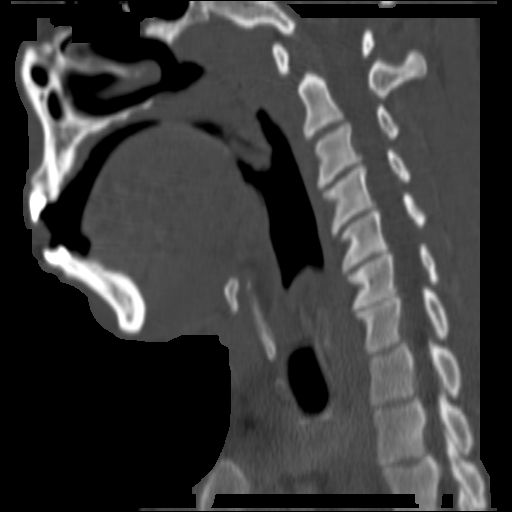

[15 of 33 positions shown; findings below may reference images not displayed]

FINDINGS: There is a superficial abscess involving the dermis and
subcutaneous tissues just below the left mandible this measures 3 x
3 x 2 cm.

There are slightly enlarged submental lymph nodes.  No mass lesion
is seen.  The major vascular structures are normal.

There is extensive periapical lucency and overt destructive bony
change involving the mandible bilaterally surrounding the wisdom
teeth.  No direct connection is identified but is most likely
present.  A small rounded lucency in the left mandible could be a
sinus tract.

The lung apices are clear.
IMPRESSION: 1.  3 x 3 right 2 cm of left submandibular abscess superficially.
2.  Extensive peri apical disease involving the mandibular wisdom
teeth bilaterally with destructive bony changes.  This likely
communicates with the superficial abscess on the left.

## 2013-05-08 ENCOUNTER — Emergency Department (HOSPITAL_COMMUNITY)
Admission: EM | Admit: 2013-05-08 | Discharge: 2013-05-08 | Disposition: A | Discharge status: Home / Self Care | Payer: Self-pay | Attending: Emergency Medicine | Admitting: Emergency Medicine

## 2013-05-08 ENCOUNTER — Encounter (HOSPITAL_COMMUNITY): Payer: Self-pay | Admitting: Emergency Medicine

## 2013-05-08 DIAGNOSIS — M2669 Other specified disorders of temporomandibular joint: Secondary | ICD-10-CM | POA: Insufficient documentation

## 2013-05-08 NOTE — ED Provider Notes (Signed)
TIME SEEN: 8:58 AM  CHIEF COMPLAINT: Right-sided jaw popping  HPI: Patient is a 42 y.o. AAM WITH NO SIGNIFICANT PAST MEDICAL HISTORY who PRESENTS TO THE EMERGENCY DEPARTMENT WITH COMPLAINTS OF POPPING OF HIS RIGHT JAW WITH EATING FOR THE PAST 2-3 DAYS. HE DENIES THAT HE HAS ANY PAIN. NO CHANGES IN HIS HEARING, TINNITUS, EAR DISCHARGE. NO DENTAL PAIN OR FACIAL SWELLING. NO FEVER. NO INJURY TO THIS AREA. HE STATES THAT HE HEARS THE POPPING MOSTLY WITH EATING AND THOUGHT THAT THIS WAS ABNORMAL AND WANTED THIS EVALUATED TODAY. HE DOES NOT HAVE A PRIMARY CARE PHYSICIAN.  ROS: See HPI Constitutional: no fever  Eyes: no drainage  ENT: no runny nose   Cardiovascular:  no chest pain  Resp: no SOB  GI: no vomiting GU: no dysuria Integumentary: no rash  Allergy: no hives  Musculoskeletal: no leg swelling  Neurological: no slurred speech ROS otherwise negative  PAST MEDICAL HISTORY/PAST SURGICAL HISTORY:  History reviewed. No pertinent past medical history.  MEDICATIONS:  Prior to Admission medications   Not on File    ALLERGIES:  Allergies no known allergies  SOCIAL HISTORY:  History  Substance Use Topics  . Smoking status: Never Smoker   . Smokeless tobacco: Not on file  . Alcohol Use: No    FAMILY HISTORY: History reviewed. No pertinent family history.  EXAM: BP 132/75  Pulse 63  Temp(Src) 98.1 F (36.7 C) (Oral)  Resp 16  SpO2 98% CONSTITUTIONAL: Alert and oriented and responds appropriately to questions. Well-appearing; well-nourished HEAD: Normocephalic EYES: Conjunctivae clear, PERRL ENT: normal nose; no rhinorrhea; moist mucous membranes; pharynx without lesions noted; good dentition, no dental abscess or caries, TMs are clear bilaterally, external auditory canals are clear and patent, the patient does have some crepitus with opening his jaw over the right TMJ but has full range of motion in his jaw with no difficulty opening or closing his mouth, no facial swelling   NECK: Supple, no meningismus, no LAD  CARD: RRR; S1 and S2 appreciated; no murmurs, no clicks, no rubs, no gallops RESP: Normal chest excursion without splinting or tachypnea; breath sounds clear and equal bilaterally; no wheezes, no rhonchi, no rales,  ABD/GI: Normal bowel sounds; non-distended; soft, non-tender, no rebound, no guarding BACK:  The back appears normal and is non-tender to palpation, there is no CVA tenderness EXT: Normal ROM in all joints; non-tender to palpation; no edema; normal capillary refill; no cyanosis    SKIN: Normal color for age and race; warm NEURO: Moves all extremities equally PSYCH: The patient's mood and manner are appropriate. Grooming and personal hygiene are appropriate.  MEDICAL DECISION MAKING:  Pt with TMJ dysfunction and crepitus. No signs of dental abscess, deep space neck infection, parotitis, meningitis, otitis media or externa, mastoiditis.  Will discharge patient with instructions to give him some tall grass for the next week with a soft diet. Given outpatient PCP followup. Given usual and customary return precautions. Informed patient to try over-the-counter ibuprofen and Tylenol if he begins to have pain. Reassured patient and he verbalized understanding and is comfortable with this plan.       Layla Maw Malya Cirillo, DO 05/08/13 [phone]

## 2013-05-08 NOTE — Discharge Instructions (Signed)
RESOURCE GUIDE    No Primary Care Doctor: - Call [facility]  [phone] - can help you locate a primary care doctor that  accepts your insurance, provides certain services, etc. - Physician Referral Service- [phone]  Agencies that provide inexpensive medical care: - [facility]  [phone] - [facility]  [phone] - [facility]  [phone] - [facility]  [phone] - [facility]  [phone] - [facility]  [phone]  Medicaid-accepting [facility] Providers: - [facility]- [address]  [phone], Mon-Fri 9am-7pm, Sat 9am-1pm - [facility]- [address] [place], [address] [place]  [phone] - [facility]- [address], [address] [place]  [phone] [facility]- [address]  [phone] - Renaye Rakers- [address] [place], [address], [phone]  Only accepts [place] patients after they have their name  applied to their card  Self Pay (no insurance) in [place]: - Sickle Cell Patients - [facility]  [address] [place], [phone] - [facility]- [address] [place]  [phone]       [facility]- [address]       -     [facility]- see information above (Speak to [employer] if you do not have insurance)       -  [facility]- [address],  [phone]       -  [facility]- [address], [phone]       -  Dr Julio Sicks-  [address], [place], [phone]       -  [facility] - [address] [phone]       -  [facility]- [address], [phone], also [address] [phone]       -     [facility]- [address] [place], [phone], 1st & 3rd Saturday         every month, 10am-1pm  -     [facility]   [address] [place], [place].   Phone:  [phone], Fax:  [phone]. Hours of Operation:  9 am - 6 pm, M-F.  -     [facility]   [address],    Phone: [phone], Fax: [phone]. Hours of Operation:  8:30 am - 5:30 pm, M-F.   Temporomandibular Problems  Temporomandibular joint (TMJ) dysfunction means there are problems with the joint between your jaw and your skull. This is a joint lined by cartilage like other joints in your body but also has a small disc in the joint which keeps the bones from rubbing on each other. These joints are like other joints and can get inflamed (sore) from arthritis and other problems. When this joint gets sore, it can cause headaches and pain in the jaw and the face. CAUSES  Usually the arthritic types of problems are caused by soreness in the joint. Soreness in the joint can also be caused by overuse. This may come from grinding your teeth. It may also come from mis-alignment in the joint. DIAGNOSIS Diagnosis of this condition can often be made by history and exam. Sometimes your caregiver may need X-rays or an MRI  scan to determine the exact cause. It may be necessary to see your dentist to determine if your teeth and jaws are lined up correctly. TREATMENT  Most of the time this problem is not serious; however, sometimes it can persist (become chronic). When this happens medications that will cut down on inflammation (soreness) help. Sometimes a shot of cortisone into the joint will be helpful. If your teeth are not aligned it may help for your dentist to make a splint for your mouth that can help this problem. If no physical problems can be found, the problem may come from tension. If tension is found to be the cause, biofeedback or relaxation techniques may be helpful. HOME CARE INSTRUCTIONS   Later in the day, applications of ice packs may be helpful. Ice can be used in a plastic bag with a towel around it to prevent  frostbite to skin. This may be used about every 2 hours for 20 to 30 minutes, as needed while awake, or as directed by your caregiver.  Only take over-the-counter or prescription medicines for pain, discomfort, or fever as directed by your caregiver.  If physical therapy was prescribed, follow your caregiver's directions.  Wear mouth appliances as directed if they were given. Document Released: 06/13/2001 Document Revised: 12/11/2011 Document Reviewed: 09/20/2008 [facility] Patient Information 2014 [place], [place].  Please rest your jaw for the next week. Please chew soft foods. No gum, status, et Karie Soda. If you develop pain, you may take ibuprofen 800 mg every 8 hours as needed for pain. Please take this medication with food. This medication is over-the-counter. If you develop fever, pain, facial swelling, difficulty opening or closing your mouth, please return to the emergency department.

## 2013-05-08 NOTE — Progress Notes (Signed)
[facility] provided patient with a [employer] application, that will help him get a pcp.

## 2013-05-08 NOTE — ED Notes (Addendum)
Pt c/o jaw and ear popping on rt side x 2 days.  States it is not painful, just annoying.

## 2014-07-06 ENCOUNTER — Emergency Department (HOSPITAL_COMMUNITY)
Admission: EM | Admit: 2014-07-06 | Discharge: 2014-07-06 | Disposition: A | Discharge status: Home / Self Care | Payer: No Typology Code available for payment source | Attending: Emergency Medicine | Admitting: Emergency Medicine

## 2014-07-06 ENCOUNTER — Encounter (HOSPITAL_COMMUNITY): Payer: Self-pay | Admitting: Emergency Medicine

## 2014-07-06 DIAGNOSIS — S0990XA Unspecified injury of head, initial encounter: Secondary | ICD-10-CM | POA: Insufficient documentation

## 2014-07-06 DIAGNOSIS — Y92410 Unspecified street and highway as the place of occurrence of the external cause: Secondary | ICD-10-CM | POA: Diagnosis not present

## 2014-07-06 DIAGNOSIS — Y9389 Activity, other specified: Secondary | ICD-10-CM | POA: Insufficient documentation

## 2014-07-06 DIAGNOSIS — H1131 Conjunctival hemorrhage, right eye: Secondary | ICD-10-CM | POA: Insufficient documentation

## 2014-07-06 DIAGNOSIS — G4489 Other headache syndrome: Secondary | ICD-10-CM

## 2014-07-06 MED ORDER — CYCLOBENZAPRINE HCL 10 MG PO TABS: 10.0000 mg | ORAL_TABLET | Freq: Two times a day (BID) | ORAL | Status: DC | PRN

## 2014-07-06 MED ORDER — TRAMADOL HCL 50 MG PO TABS: 50.0000 mg | ORAL_TABLET | Freq: Four times a day (QID) | ORAL | Status: DC | PRN
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
## 2014-07-06 NOTE — Discharge Instructions (Signed)
Take Tramadol as needed for pain. Take Flexeril for muscle spasms as needed. You may take these medications together. Refer to attached documents for more information.
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
## 2014-07-09 ENCOUNTER — Encounter (HOSPITAL_COMMUNITY): Payer: Self-pay | Admitting: Emergency Medicine

## 2014-07-09 ENCOUNTER — Emergency Department (HOSPITAL_COMMUNITY)
Admission: EM | Admit: 2014-07-09 | Discharge: 2014-07-09 | Disposition: A | Discharge status: Home / Self Care | Payer: No Typology Code available for payment source | Attending: Emergency Medicine | Admitting: Emergency Medicine

## 2014-07-09 DIAGNOSIS — S3982XD Other specified injuries of lower back, subsequent encounter: Secondary | ICD-10-CM | POA: Insufficient documentation

## 2014-07-09 DIAGNOSIS — S0990XD Unspecified injury of head, subsequent encounter: Secondary | ICD-10-CM | POA: Diagnosis not present

## 2014-07-09 DIAGNOSIS — M62838 Other muscle spasm: Secondary | ICD-10-CM

## 2014-07-09 DIAGNOSIS — M62830 Muscle spasm of back: Secondary | ICD-10-CM

## 2014-07-09 DIAGNOSIS — Y9389 Activity, other specified: Secondary | ICD-10-CM | POA: Diagnosis not present

## 2014-07-09 DIAGNOSIS — S139XXD Sprain of joints and ligaments of unspecified parts of neck, subsequent encounter: Secondary | ICD-10-CM | POA: Insufficient documentation

## 2014-07-09 DIAGNOSIS — M545 Low back pain, unspecified: Secondary | ICD-10-CM

## 2014-07-09 DIAGNOSIS — R51 Headache: Secondary | ICD-10-CM

## 2014-07-09 DIAGNOSIS — R519 Headache, unspecified: Secondary | ICD-10-CM

## 2014-07-09 DIAGNOSIS — S161XXD Strain of muscle, fascia and tendon at neck level, subsequent encounter: Secondary | ICD-10-CM

## 2014-07-09 MED ORDER — TRAMADOL HCL 50 MG PO TABS: 50.0000 mg | ORAL_TABLET | Freq: Four times a day (QID) | ORAL | Status: DC | PRN

## 2014-07-09 MED ORDER — CYCLOBENZAPRINE HCL 10 MG PO TABS: 10.0000 mg | ORAL_TABLET | Freq: Three times a day (TID) | ORAL | Status: DC | PRN

## 2014-07-09 MED ORDER — NAPROXEN 500 MG PO TABS: 500.0000 mg | ORAL_TABLET | Freq: Two times a day (BID) | ORAL | Status: DC | PRN
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
## 2014-07-09 NOTE — ED Provider Notes (Signed)
Medical screening examination/treatment/procedure(s) were performed by non-physician practitioner and as supervising physician I was immediately available for consultation/collaboration.   EKG Interpretation None        Dajanee Voorheis, MD 07/09/14 1535 

## 2014-07-09 NOTE — ED Provider Notes (Signed)
Medical screening examination/treatment/procedure(s) were performed by non-physician practitioner and as supervising physician I was immediately available for consultation/collaboration.   EKG Interpretation None        Matthew Gentry, MD 07/09/14 0712 

## 2014-07-13 ENCOUNTER — Encounter (HOSPITAL_COMMUNITY): Payer: Self-pay | Admitting: Emergency Medicine

## 2014-07-13 ENCOUNTER — Emergency Department (HOSPITAL_COMMUNITY)
Admission: EM | Admit: 2014-07-13 | Discharge: 2014-07-13 | Disposition: A | Discharge status: Home / Self Care | Payer: No Typology Code available for payment source | Attending: Emergency Medicine | Admitting: Emergency Medicine

## 2014-07-13 DIAGNOSIS — Y92410 Unspecified street and highway as the place of occurrence of the external cause: Secondary | ICD-10-CM | POA: Insufficient documentation

## 2014-07-13 DIAGNOSIS — S060X0A Concussion without loss of consciousness, initial encounter: Secondary | ICD-10-CM | POA: Insufficient documentation

## 2014-07-13 DIAGNOSIS — Y9389 Activity, other specified: Secondary | ICD-10-CM | POA: Diagnosis not present

## 2014-07-13 DIAGNOSIS — S299XXA Unspecified injury of thorax, initial encounter: Secondary | ICD-10-CM | POA: Diagnosis not present

## 2014-07-13 DIAGNOSIS — R42 Dizziness and giddiness: Secondary | ICD-10-CM | POA: Diagnosis present

## 2014-07-13 DIAGNOSIS — M545 Low back pain: Secondary | ICD-10-CM

## 2014-07-13 MED ORDER — TRAMADOL HCL 50 MG PO TABS: 50.0000 mg | ORAL_TABLET | Freq: Four times a day (QID) | ORAL | Status: DC | PRN
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
## 2014-07-17 NOTE — ED Provider Notes (Signed)
Medical screening examination/treatment/procedure(s) were performed by non-physician practitioner and as supervising physician I was immediately available for consultation/collaboration.   EKG Interpretation   Date/Time:  Monday July 13 2014 16:06:39 EDT Ventricular Rate:  85 PR Interval:  182 QRS Duration: 81 QT Interval:  348 QTC Calculation: 414 R Axis:   43 Text Interpretation:  Sinus rhythm Consider left atrial enlargement RSR'  in V1 or V2, right VCD or RVH ST elevation suggests acute pericarditis  Baseline wander in lead(s) V2 V4 ED PHYSICIAN INTERPRETATION AVAILABLE IN  [facility] Confirmed by TEST, Record ([id number]) on 07/15/2014 7:26:51 AM        Richardean Canalavid H Bhavika Schnider, MD 07/17/14 [phone]

## 2014-07-19 ENCOUNTER — Encounter (HOSPITAL_COMMUNITY): Payer: Self-pay | Admitting: Emergency Medicine

## 2014-07-19 ENCOUNTER — Emergency Department (HOSPITAL_COMMUNITY)
Admission: EM | Admit: 2014-07-19 | Discharge: 2014-07-19 | Disposition: A | Discharge status: Home / Self Care | Payer: No Typology Code available for payment source | Attending: Emergency Medicine | Admitting: Emergency Medicine

## 2014-07-19 ENCOUNTER — Emergency Department (HOSPITAL_COMMUNITY): Payer: No Typology Code available for payment source

## 2014-07-19 DIAGNOSIS — Z79899 Other long term (current) drug therapy: Secondary | ICD-10-CM | POA: Insufficient documentation

## 2014-07-19 DIAGNOSIS — R51 Headache: Secondary | ICD-10-CM | POA: Diagnosis not present

## 2014-07-19 DIAGNOSIS — F0781 Postconcussional syndrome: Secondary | ICD-10-CM | POA: Diagnosis not present

## 2014-07-19 MED ORDER — KETOROLAC TROMETHAMINE 30 MG/ML IJ SOLN
30.0000 mg | Freq: Once | INTRAMUSCULAR | Status: AC
  Administered 2014-07-19: 30 mg via INTRAVENOUS
  Filled 2014-07-19: qty 1

## 2014-07-19 MED ORDER — IBUPROFEN 800 MG PO TABS: 800.0000 mg | ORAL_TABLET | Freq: Three times a day (TID) | ORAL | Status: DC | PRN

## 2014-07-19 MED ORDER — PROCHLORPERAZINE EDISYLATE 5 MG/ML IJ SOLN
10.0000 mg | Freq: Once | INTRAMUSCULAR | Status: AC
  Administered 2014-07-19: 10 mg via INTRAVENOUS
  Filled 2014-07-19: qty 2

## 2014-07-19 MED ORDER — SODIUM CHLORIDE 0.9 % IV BOLUS (SEPSIS)
1000.0000 mL | Freq: Once | INTRAVENOUS | Status: AC
  Administered 2014-07-19: 1000 mL via INTRAVENOUS

## 2014-07-19 NOTE — ED Notes (Addendum)
Pt c/o migraine x 3 days with blurred vision and dizziness. He has been taking tramadol for pain, last dose was this am. Denies nausea or vomiting. Sensitivity to light

## 2014-07-19 NOTE — Discharge Instructions (Signed)
Return here as needed.  Followup with a primary care Dr. or urgent care by her CT scan was negative.  You have a postconcussive syndrome Post-Concussion Syndrome Post-concussion syndrome means you have problems after a head injury. The problems can last for weeks or months. The problems usually go away on their own over time. HOME CARE   Only take medicines as told by your doctor. Do not take aspirin.  Sleep with your head raised (elevated) to help with headaches.  Avoid activities that can cause another head injury.  Do not play contact sports like football, hockey, soccer or basketball. Do not do other risky activities like downhill skiing or martial arts, or ride horses until your doctor says it is OK.  Keep all doctor visits as told. GET HELP RIGHT AWAY IF:  You feel confused or very sleepy.  You cannot wake the injured person.  You feel sick to your stomach (nauseous) or keep throwing up (vomiting).  You feel like you are moving when you are not (vertigo).  You notice the injured person's eyes moving back and forth very fast.  You start shaking (convulsing) or pass out (faint).  You have very bad headaches that do not get better with medicine.  You cannot use your arms or legs normally.  The black centers of your eyes (pupils) change size.  You have clear or bloody fluid coming from your nose or ears.  Your problems get worse, not better. MAKE SURE YOU:  Understand these instructions.  Will watch your condition.  Will get help right away if you are not doing well or get worse. Document Released: 10/26/2004 Document Revised: 07/09/2013 Document Reviewed: 12/24/2013 [facility]Exit[facility] Patient Information 2015 [place]ExitCare, [place]LL[place]. This information is not intended to replace advice given to you by your health care provider. Make sure you discuss any questions you have with your health care provider.
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
## 2014-07-20 NOTE — ED Provider Notes (Signed)
  Medical screening examination/treatment/procedure(s) were performed by non-physician practitioner and as supervising physician I was immediately available for consultation/collaboration.    Gerhard Munchobert Modean Mccullum, MD 07/20/14 [phone]

## 2014-07-24 NOTE — ED Notes (Signed)
Patient needed/requested d/c paperwork

## 2014-08-23 ENCOUNTER — Emergency Department (HOSPITAL_COMMUNITY): Payer: No Typology Code available for payment source

## 2014-08-23 ENCOUNTER — Encounter (HOSPITAL_COMMUNITY): Payer: Self-pay | Admitting: Emergency Medicine

## 2014-08-23 ENCOUNTER — Emergency Department (HOSPITAL_COMMUNITY)
Admission: EM | Admit: 2014-08-23 | Discharge: 2014-08-23 | Disposition: A | Discharge status: Home / Self Care | Payer: No Typology Code available for payment source | Attending: Emergency Medicine | Admitting: Emergency Medicine

## 2014-08-23 DIAGNOSIS — Z79899 Other long term (current) drug therapy: Secondary | ICD-10-CM | POA: Insufficient documentation

## 2014-08-23 DIAGNOSIS — M546 Pain in thoracic spine: Secondary | ICD-10-CM | POA: Insufficient documentation

## 2014-08-23 DIAGNOSIS — N63 Unspecified lump in breast: Secondary | ICD-10-CM | POA: Insufficient documentation

## 2014-08-23 DIAGNOSIS — N6320 Unspecified lump in the left breast, unspecified quadrant: Secondary | ICD-10-CM

## 2014-08-23 DIAGNOSIS — R079 Chest pain, unspecified: Secondary | ICD-10-CM

## 2014-08-23 LAB — CBC
HCT: 45 % (ref 39.0–52.0)
Hemoglobin: 15.5 g/dL (ref 13.0–17.0)
MCH: 30.7 pg (ref 26.0–34.0)
MCHC: 34.4 g/dL (ref 30.0–36.0)
MCV: 89.1 fL (ref 78.0–100.0)
PLATELETS: 256 10*3/uL (ref 150–400)
RBC: 5.05 MIL/uL (ref 4.22–5.81)
RDW: 12.1 % (ref 11.5–15.5)
WBC: 4.2 10*3/uL (ref 4.0–10.5)

## 2014-08-23 LAB — BASIC METABOLIC PANEL
Anion gap: 10 (ref 5–15)
BUN: 18 mg/dL (ref 6–23)
CALCIUM: 10.2 mg/dL (ref 8.4–10.5)
CO2: 29 mEq/L (ref 19–32)
CREATININE: 1.27 mg/dL (ref 0.50–1.35)
Chloride: 101 mEq/L (ref 96–112)
GFR calc Af Amer: 77 mL/min — ABNORMAL LOW (ref >90)
GFR, EST NON AFRICAN AMERICAN: 67 mL/min — AB (ref >90)
GLUCOSE: 86 mg/dL (ref 70–99)
Potassium: 4.9 mEq/L (ref 3.7–5.3)
SODIUM: 140 meq/L (ref 137–147)

## 2014-08-23 LAB — I-STAT TROPONIN, ED: Troponin i, poc: 0.01 ng/mL (ref 0.00–0.08)

## 2014-08-23 NOTE — ED Notes (Signed)
Pt co left sided chest pain x 1 month along with back pain, states that he noticed left nipples droops down more. Denies n/v.

## 2014-08-23 NOTE — Discharge Instructions (Signed)
Ibuprofen or advil for pain. Avoid any injuries. Follow up with breast center.

## 2014-08-23 NOTE — ED Provider Notes (Signed)
CSN: [id number]     Arrival date & time 08/23/14  1032 History   First MD Initiated Contact with Patient 08/23/14 1129     Chief Complaint  Patient presents with  . Chest Pain  . Back Pain     (Consider location/radiation/quality/duration/timing/severity/associated sxs/prior Treatment) HPI Ian Jenkins is a 43 y.o. male who presents to emergency department complaining of sharp pain in his left chest, as well as some pain in his left upper back. He states pain has been there for a month. It comes and goes. Also noticed that his left nipple is pointing more down than the right. States he can feel a mass under the nipple. No nipple discharge. Some tenderness to palpation over the breast. States he works out a lot, thinks that is why his back is sore. He denies any prior medical problems, denies any recent travel or surgeries. No swelling in extremities. He is not a smoker. No family history of coronary disease. No pain with deep inspirations. No urinary symptoms. He has not tried any medications at home for this.  History reviewed. No pertinent past medical history. Past Surgical History  Procedure Laterality Date  . Empyema surgery     No family history on file. History  Substance Use Topics  . Smoking status: Never Smoker   . Smokeless tobacco: Not on file  . Alcohol Use: No    Review of Systems  Constitutional: Negative for fever and chills.  Respiratory: Negative for cough, chest tightness and shortness of breath.   Cardiovascular: Positive for chest pain. Negative for palpitations and leg swelling.  Gastrointestinal: Negative for nausea, vomiting, abdominal pain, diarrhea and abdominal distention.  Genitourinary: Negative for dysuria, urgency, frequency and hematuria.  Musculoskeletal: Positive for back pain. Negative for myalgias, arthralgias, neck pain and neck stiffness.  Skin: Negative for rash.  Allergic/Immunologic: Negative for immunocompromised state.  Neurological:  Negative for dizziness, weakness, light-headedness, numbness and headaches.      Allergies  Other  Home Medications   Prior to Admission medications   Medication Sig Start Date End Date Taking? Authorizing Qunisha Bryk  cyclobenzaprine (FLEXERIL) 10 MG tablet Take 10 mg by mouth 3 (three) times daily as needed for muscle spasms (muscle spasms).   Yes Historical Tanikka Bresnan, MD  ibuprofen (ADVIL,MOTRIN) 800 MG tablet Take 1 tablet (800 mg total) by mouth every 8 (eight) hours as needed. 07/19/14  Yes Christopher W Lawyer, PA-C  OVER THE COUNTER MEDICATION Take 1 scoop by mouth daily. Sports drink. (6 star pre workout supplement)   Yes Historical Terriann Difonzo, MD  Tdap (BOOSTRIX) 5-2.5-18.5 LF-MCG/0.5 injection Inject 0.5 mLs into the muscle once.   Yes Historical Peighton Mehra, MD  traMADol (ULTRAM) 50 MG tablet Take 50 mg by mouth every 6 (six) hours as needed for moderate pain (pain).    Yes Historical Marvelyn Bouchillon, MD   BP 142/70 mmHg  Pulse 81  Temp(Src) 98.5 F (36.9 C) (Oral)  Resp 18  SpO2 96% Physical Exam  Constitutional: He appears well-developed and well-nourished. No distress.  HENT:  Head: Normocephalic and atraumatic.  Eyes: Conjunctivae are normal.  Neck: Neck supple.  Cardiovascular: Normal rate, regular rhythm and normal heart sounds.   Pulmonary/Chest: Effort normal. No respiratory distress. He has no wheezes. He has no rales. He exhibits tenderness.  Small, about 1x1 cm mobile mass under left nipple. No drainage. Mild tenderness to palpation. No skin erythema, induration.   Abdominal: Soft. Bowel sounds are normal. He exhibits no distension. There is no tenderness.  There is no rebound.  Musculoskeletal: He exhibits no edema.  Neurological: He is alert.  Skin: Skin is warm and dry.  Nursing note and vitals reviewed.   ED Course  Procedures (including critical care time) Labs Review Labs Reviewed  CBC  BASIC METABOLIC PANEL  Rosezena SensorI-STAT TROPOININ, ED    Imaging Review Dg  Chest 2 View  08/23/2014   CLINICAL DATA:  Left-sided chest pain 1 month.  EXAM: CHEST  2 VIEW  COMPARISON:  11/16/2009  FINDINGS: Lungs are adequately inflated with mild chronic stable blunting of the left costophrenic angle. No focal consolidation. Mild stable cardiomegaly. Remainder the exam is unchanged.  IMPRESSION: No active cardiopulmonary disease.   Electronically Signed   By: Elberta Fortisaniel  Boyle M.D.   On: 08/23/2014 11:21     EKG Interpretation   Date/Time:  Sunday August 23 2014 10:47:10 EST Ventricular Rate:  87 PR Interval:  168 QRS Duration: 83 QT Interval:  330 QTC Calculation: 397 R Axis:   66 Text Interpretation:  Sinus rhythm Biatrial enlargement RSR' in V1 or V2,  right VCD or RVH ST elevation suggests acute pericarditis Unchanged from  prior Confirmed by Rhunette CroftNANAVATI, MD, Janey GentaANKIT [phone] on 08/23/2014 10:53:49 AM      MDM   Final diagnoses:  Mass of breast, left     Patient with no risk factors for coronary disease, here with intermittent sharp left-sided chest pain that's her producible with palpation. Also left breast mass. Symptoms are unlikely due to coronary disease, doubt PE, he is PERC negative. He admits to hitting left breast while bench pressing. Left breast mass is palpable, no nipple drainage, no erythema or induration. Doubt infectious. Suspect hematoma from injury versus a mass. Labs all unremarkable. EKG suggesting pericarditis, however it is unchanged from prior EKG, and given patient's symptoms doubt pericarditis. Will start on anti-inflammatories anyways. We'll follow-up with breast center regarding the mass.  Filed Vitals:   08/23/14 1042  BP: 142/70  Pulse: 81  Temp: 98.5 F (36.9 C)  TempSrc: Oral  Resp: 18  SpO2: 96%     Lottie Musselatyana A Kirichenko, PA-C 08/23/14 1232  Derwood KaplanAnkit Nanavati, MD 08/23/14 1652

## 2014-08-24 ENCOUNTER — Other Ambulatory Visit (HOSPITAL_COMMUNITY): Payer: Self-pay | Admitting: Emergency Medicine

## 2014-08-24 DIAGNOSIS — N63 Unspecified lump in unspecified breast: Secondary | ICD-10-CM

## 2016-08-01 IMAGING — CT CT HEAD W/O CM
2 series · 17 of 30 positions shown, 20 images · non-contrast
Comparison: Facial CT 11/12/2010

CLINICAL DATA: Migraine headache, 3 days duration. Blurred vision
and dizziness.

EXAM:
CT HEAD WITHOUT CONTRAST
TECHNIQUE: Contiguous axial images were obtained from the base of the skull
through the vertex without intravenous contrast.

[Series 2: head w/o · axial · non-contrast · 0.45mm/px · z∈[-119,-4]mm · 9 of 29 slices shown, 12 images]
[im 3/29  brain]
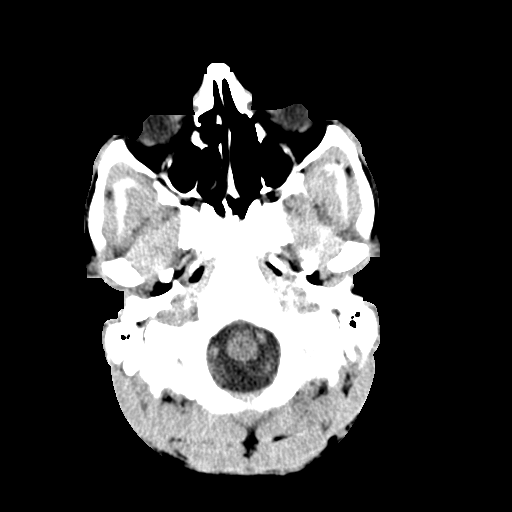
[im 3/29  bone]
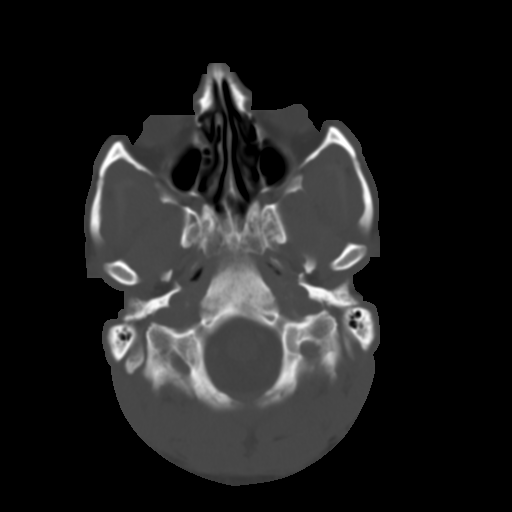
[im 6/29  brain]
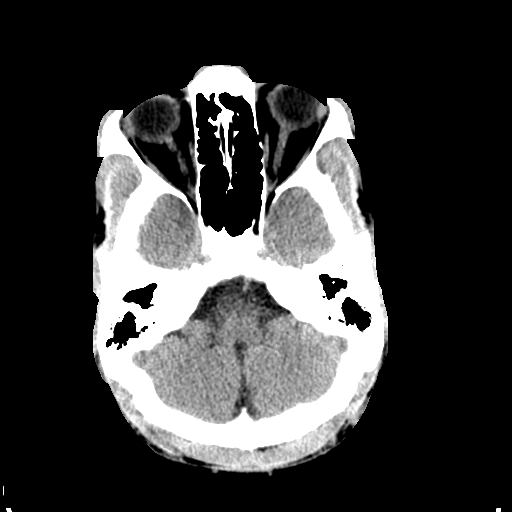
[im 9/29  brain]
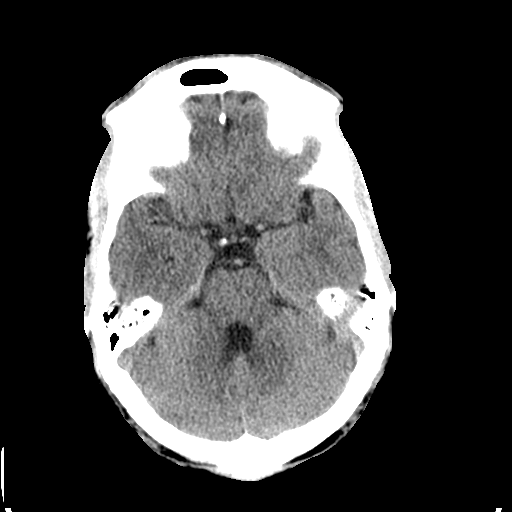
[im 12/29  brain]
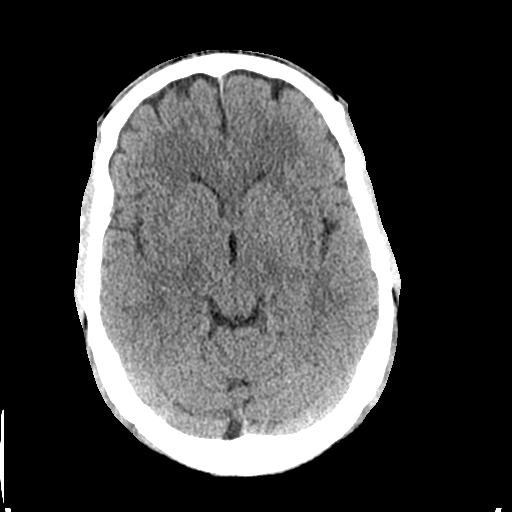
[im 15/29  brain]
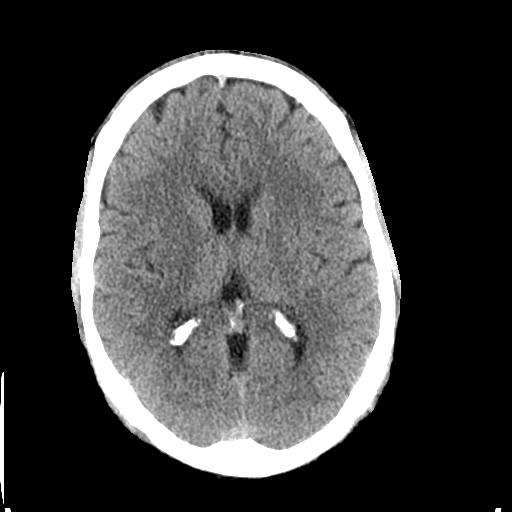
[im 15/29  bone]
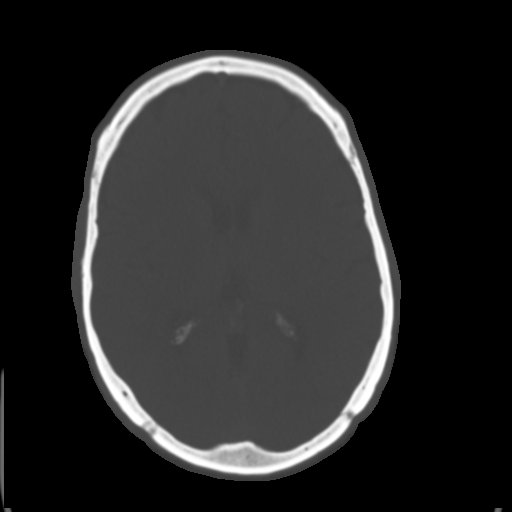
[im 17/29  brain]
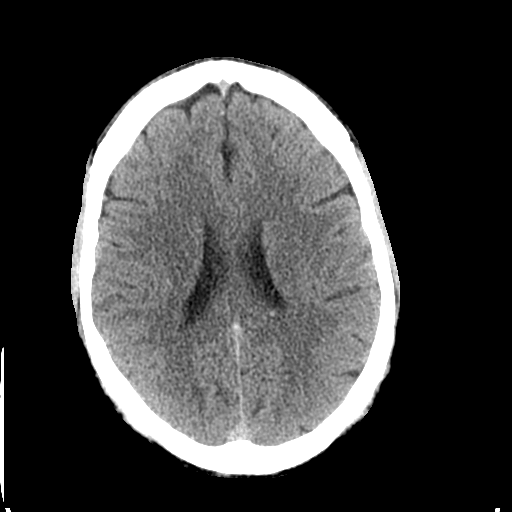
[im 20/29  brain]
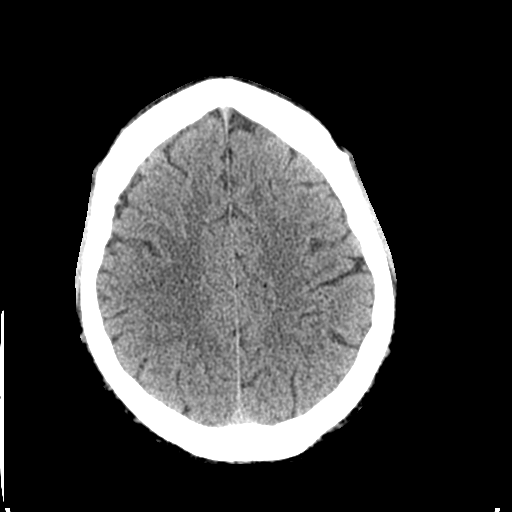
[im 23/29  brain]
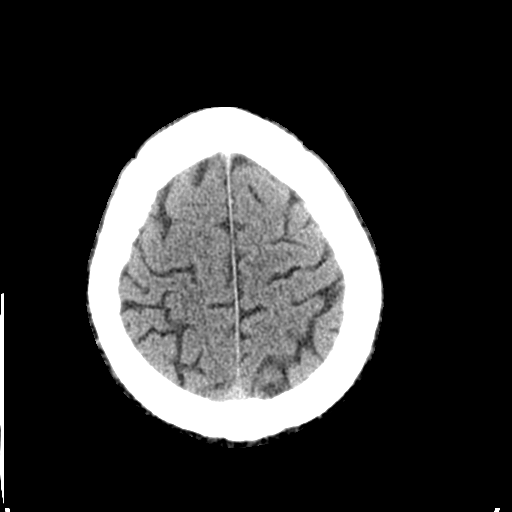
[im 26/29  brain]
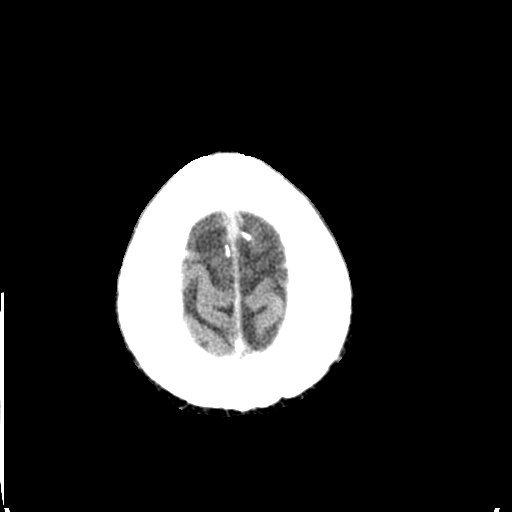
[im 26/29  bone]
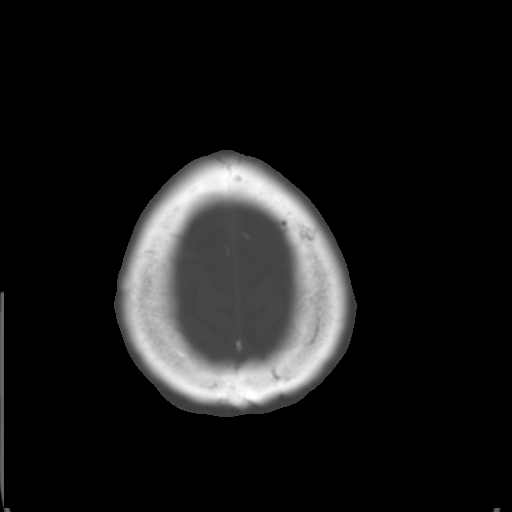

[Series 3: bone windows · axial · 0.45mm/px · z∈[-114,-3]mm · 8 of 49 slices shown]
[im 6/49  bone]
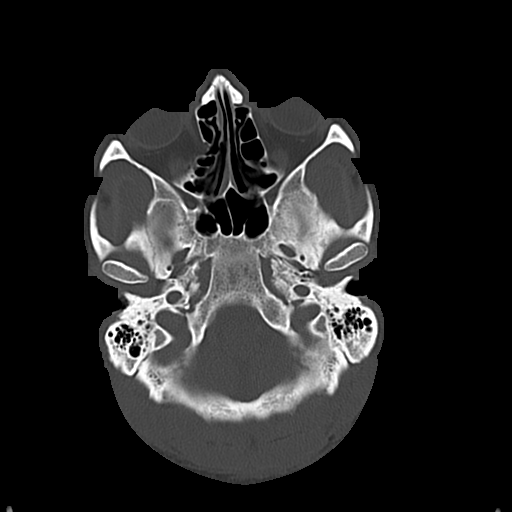
[im 11/49  bone]
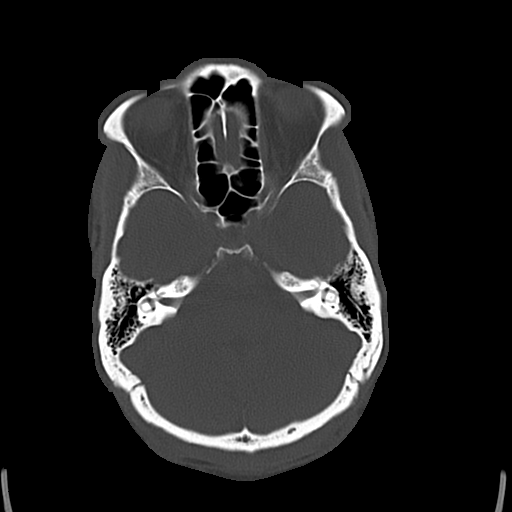
[im 17/49  bone]
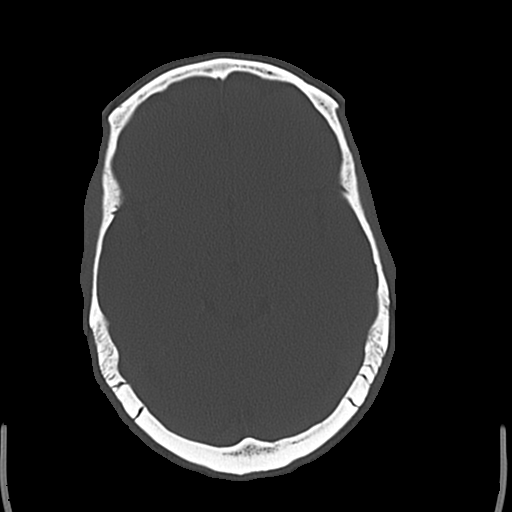
[im 22/49  bone]
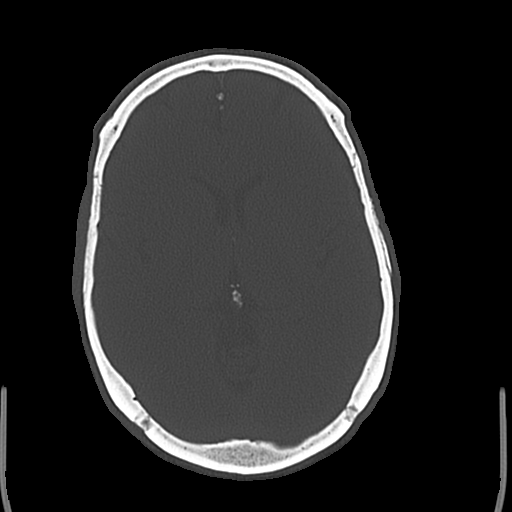
[im 27/49  bone]
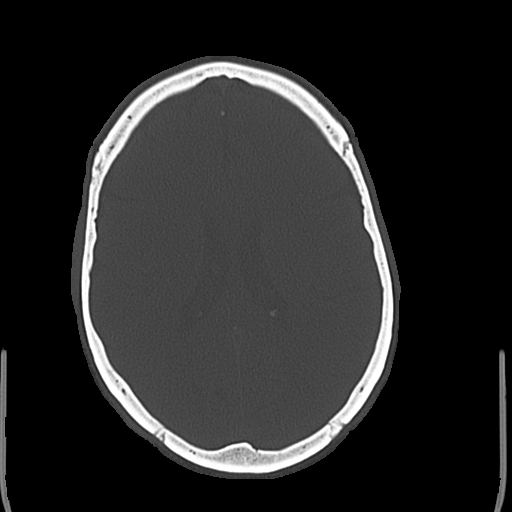
[im 33/49  bone]
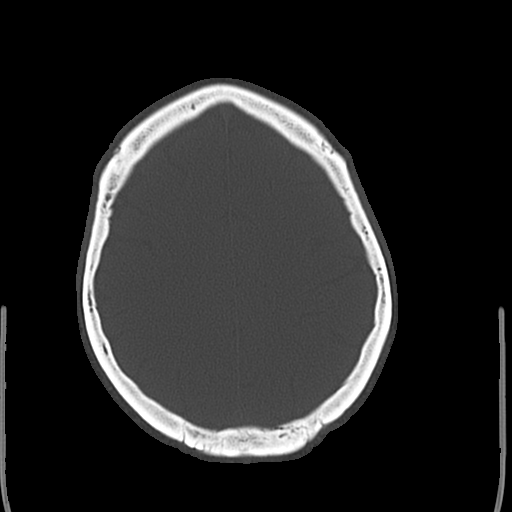
[im 38/49  bone]
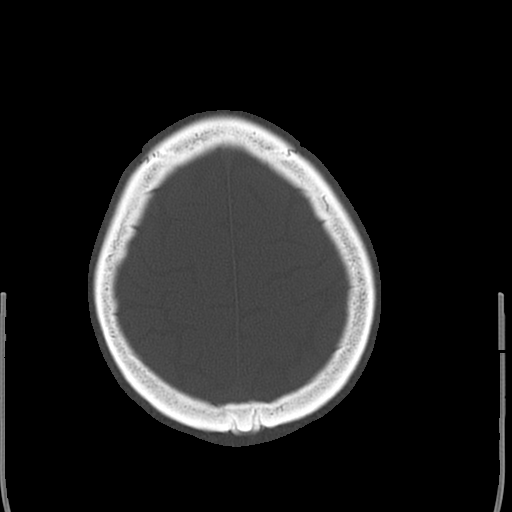
[im 43/49  bone]
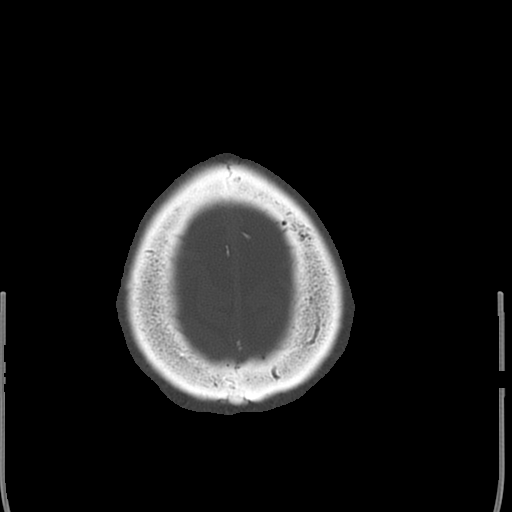

[17 of 30 positions shown; findings below may reference images not displayed]

FINDINGS: The brain has a normal appearance without evidence of atrophy,
infarction, mass lesion, hemorrhage, hydrocephalus or extra-axial
collection. The calvarium is unremarkable. The paranasal sinuses,
middle ears and mastoids are clear.
IMPRESSION: Normal head CT.  No cause of headache identified.

## 2017-10-07 ENCOUNTER — Encounter (HOSPITAL_COMMUNITY): Payer: Self-pay | Admitting: Emergency Medicine

## 2017-10-07 ENCOUNTER — Emergency Department (HOSPITAL_COMMUNITY)
Admission: EM | Admit: 2017-10-07 | Discharge: 2017-10-07 | Disposition: A | Discharge status: Home / Self Care | Payer: No Typology Code available for payment source | Attending: Emergency Medicine | Admitting: Emergency Medicine

## 2017-10-07 DIAGNOSIS — M7918 Myalgia, other site: Secondary | ICD-10-CM | POA: Diagnosis present

## 2017-10-07 MED ORDER — IBUPROFEN 200 MG PO TABS
400.0000 mg | ORAL_TABLET | Freq: Once | ORAL | Status: AC | PRN
  Administered 2017-10-07: 400 mg via ORAL
  Filled 2017-10-07: qty 2

## 2017-10-07 MED ORDER — IBUPROFEN 800 MG PO TABS: 800.0000 mg | ORAL_TABLET | Freq: Three times a day (TID) | ORAL | 0 refills | Status: DC | PRN

## 2017-10-07 MED ORDER — CYCLOBENZAPRINE HCL 10 MG PO TABS: 10.0000 mg | ORAL_TABLET | Freq: Three times a day (TID) | ORAL | 0 refills | Status: DC | PRN

## 2017-10-07 NOTE — ED Notes (Signed)
Bed: WTR5 Expected date:  Expected time:  Means of arrival:  Comments: 
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
## 2017-10-17 ENCOUNTER — Ambulatory Visit (HOSPITAL_COMMUNITY)
Admission: EM | Admit: 2017-10-17 | Discharge: 2017-10-17 | Disposition: A | Discharge status: Home / Self Care | Payer: Self-pay | Attending: Urgent Care | Admitting: Urgent Care

## 2017-10-17 ENCOUNTER — Encounter (HOSPITAL_COMMUNITY): Payer: Self-pay | Admitting: Emergency Medicine

## 2017-10-17 ENCOUNTER — Emergency Department (HOSPITAL_COMMUNITY)
Admission: EM | Admit: 2017-10-17 | Discharge: 2017-10-17 | Discharge status: Absent without leave | Payer: No Typology Code available for payment source

## 2017-10-17 DIAGNOSIS — G44209 Tension-type headache, unspecified, not intractable: Secondary | ICD-10-CM

## 2017-10-17 DIAGNOSIS — M436 Torticollis: Secondary | ICD-10-CM

## 2017-10-17 MED ORDER — MELOXICAM 15 MG PO TABS: 7.5000 mg | ORAL_TABLET | Freq: Every day | ORAL | 0 refills | Status: DC
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
## 2017-10-17 NOTE — Discharge Instructions (Signed)
Make sure you hydrate well with at least 64 ounces (1 gallon) of water daily. Stop Tramadol, start meloxicam 1/2-1 tablet just once daily.

## 2020-11-23 ENCOUNTER — Encounter: Payer: Self-pay | Admitting: Medical

## 2020-11-23 ENCOUNTER — Other Ambulatory Visit: Payer: Self-pay

## 2020-11-23 ENCOUNTER — Ambulatory Visit: Payer: BC Managed Care – PPO | Admitting: Medical

## 2020-11-23 VITALS — BP 128/82 | HR 81 | Ht 65.5 in | Wt 185.8 lb

## 2020-11-23 DIAGNOSIS — Z0000 Encounter for general adult medical examination without abnormal findings: Secondary | ICD-10-CM

## 2020-11-23 DIAGNOSIS — Z7185 Encounter for immunization safety counseling: Secondary | ICD-10-CM

## 2020-11-23 DIAGNOSIS — Z1159 Encounter for screening for other viral diseases: Secondary | ICD-10-CM | POA: Diagnosis not present

## 2020-11-23 DIAGNOSIS — Z1211 Encounter for screening for malignant neoplasm of colon: Secondary | ICD-10-CM | POA: Diagnosis not present

## 2020-11-23 DIAGNOSIS — R42 Dizziness and giddiness: Secondary | ICD-10-CM | POA: Diagnosis not present

## 2020-11-23 DIAGNOSIS — Z23 Encounter for immunization: Secondary | ICD-10-CM

## 2020-11-23 DIAGNOSIS — Z113 Encounter for screening for infections with a predominantly sexual mode of transmission: Secondary | ICD-10-CM | POA: Diagnosis not present

## 2020-11-23 DIAGNOSIS — Z125 Encounter for screening for malignant neoplasm of prostate: Secondary | ICD-10-CM | POA: Diagnosis not present

## 2020-11-23 DIAGNOSIS — Z136 Encounter for screening for cardiovascular disorders: Secondary | ICD-10-CM

## 2020-11-23 DIAGNOSIS — Z8709 Personal history of other diseases of the respiratory system: Secondary | ICD-10-CM | POA: Insufficient documentation

## 2020-11-23 LAB — POCT URINALYSIS DIP (PROADVANTAGE DEVICE)
Bilirubin, UA: NEGATIVE
Blood, UA: NEGATIVE
Glucose, UA: NEGATIVE mg/dL
Ketones, POC UA: NEGATIVE mg/dL
Leukocytes, UA: NEGATIVE
Nitrite, UA: NEGATIVE
Protein Ur, POC: NEGATIVE mg/dL
Specific Gravity, Urine: 1.015
Urobilinogen, Ur: 0.2
pH, UA: 6.5 (ref 5.0–8.0)

## 2020-11-23 LAB — COMPREHENSIVE METABOLIC PANEL

## 2020-11-23 LAB — CBC WITH DIFFERENTIAL/PLATELET
Basos: 1 %
Hematocrit: 41.6 % (ref 37.5–51.0)
MCHC: 34.1 g/dL (ref 31.5–35.7)
Neutrophils: 35 %

## 2020-11-23 LAB — LIPID PANEL

## 2020-11-23 LAB — VITAMIN B12

## 2020-11-23 MED ORDER — MECLIZINE HCL 25 MG PO TABS: 25.0000 mg | ORAL_TABLET | Freq: Two times a day (BID) | ORAL | 0 refills | Status: DC

## 2020-11-23 NOTE — Patient Instructions (Signed)
   Today you had a preventative care visit or wellness visit.    Topics today may have included healthy lifestyle, diet, exercise, preventative care, vaccinations, sick and well care, proper use of emergency dept and after hours care, as well as other concerns.     Recommendations: Continue to return yearly for your annual wellness and preventative care visits.  This gives [place] a chance to discuss healthy lifestyle, exercise, vaccinations, review your chart record, and perform screenings where appropriate.  I recommend you see your eye doctor yearly for routine vision care.  I recommend you see your dentist yearly for routine dental care including hygiene visits twice yearly.   Vaccination recommendations were reviewed  You declined flu and COVID vaccine  Shingles vaccine:  I recommend you have a shingles vaccine to help prevent shingles or herpes zoster outbreak.   Please call your insurer to inquire about coverage for the Shingrix vaccine given in 2 doses.   Some insurers cover this vaccine after age 17, some cover this after age 29.  If your insurer covers this, then call to schedule appointment to have this vaccine here.  Counseled on the Tdap (tetanus, diptheria, and acellular pertussis) vaccine.  Vaccine information sheet given. Tdap vaccine given after consent obtained.  Counseled on the pneumococcal vaccine.  Vaccine information sheet given.  Pneumococcal vaccine PPSV23 given after consent obtained.    Screening for cancer: Colon cancer screening:  Pending labs will refer to either colonoscopy or Cologuard  We discussed PSA, prostate exam, and prostate cancer screening risks/benefits.     Skin cancer screening: Check your skin regularly for new changes, growing lesions, or other lesions of concern Come in for evaluation if you have skin lesions of concern.  Lung cancer screening: If you have a greater than 30 pack year history of tobacco use, then you qualify for lung cancer  screening with a chest CT scan  We currently don't have screenings for other cancers besides breast, cervical, colon, and lung cancers.  If you have a strong family history of cancer or have other cancer screening concerns, please let me know.    Bone health: Get at least 150 minutes of aerobic exercise weekly Get weight bearing exercise at least once weekly   Heart health: Get at least 150 minutes of aerobic exercise weekly Limit alcohol It is important to maintain a healthy blood pressure and healthy cholesterol numbers   EKG screen today   Screening for sexually transmitted infections: We discussed testing, prevention, and means of transmission    Separate significant issues discussed: Vertigo -begin trial of meclizine.  Discussed proper use of medicine, risk and benefits of medicine.  If not much improved after the next 2 to 3 weeks consider vestibular rehab or ENT consult

## 2020-11-23 NOTE — Progress Notes (Signed)
Subjective:   HPI  Ian Jenkins is a 50 y.o. male who presents for Chief Complaint  Patient presents with  . Annual Exam    Physical with fasting labs    New patient to establish care today  Concerns: Gets vertigo fairly regularly.  Episodes are brief, but does get them quite regularly.  No hearing loss no ringing in the ears.  No head injury.  Otherwise normal state of health  Reviewed their medical, surgical, family, social, medication, and allergy history and updated chart as appropriate.  Past Medical History:  Diagnosis Date  . 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 EMPYEMA DRAINAGE  09/25/2009  . empyema surgery      Family History  Problem Relation Age of Onset  . Stroke Mother        TIA  . Heart failure Father   . Heart disease Father        47  . Hypertension Father   . Alzheimer's disease Maternal Grandmother   . Cancer Neg Hx      Current Outpatient Medications:  .  meclizine (ANTIVERT) 25 MG tablet, Take 1 tablet (25 mg total) by mouth 2 (two) times daily., Disp: 30 tablet, Rfl: 0 .  Tdap (BOOSTRIX) 5-2.5-18.5 LF-MCG/0.5 injection, Inject 0.5 mLs into the muscle once. (Patient not taking: No sig reported), Disp: , Rfl:   Allergies  Allergen Reactions  . Other Nausea And Vomiting    Only Scallops       Review of Systems Constitutional: -fever, -chills, -sweats, -unexpected weight change, -decreased appetite, -fatigue Allergy: -sneezing, -itching, -congestion Dermatology: -changing moles, --rash, -lumps ENT: -runny nose, -ear pain, -sore throat, -hoarseness, -sinus pain, -teeth pain, - ringing in ears, -hearing loss, -nosebleeds Cardiology: -chest pain, -palpitations, -swelling, -difficulty breathing when lying flat, -waking up short of breath Respiratory: -cough, -shortness of breath, -difficulty breathing with exercise or exertion, -wheezing, -coughing up blood Gastroenterology: -abdominal  pain, -nausea, -vomiting, -diarrhea, -constipation, -blood in stool, -changes in bowel movement, -difficulty swallowing or eating Hematology: -bleeding, -bruising  Musculoskeletal: -joint aches, -muscle aches, -joint swelling, -back pain, -neck pain, -cramping, -changes in gait Ophthalmology: denies vision changes, eye redness, itching, discharge Urology: -burning with urination, -difficulty urinating, -blood in urine, -urinary frequency, -urgency, -incontinence Neurology: -headache, -weakness, -tingling, -numbness, -memory loss, -falls, -dizziness Psychology: -depressed mood, -agitation, -sleep problems Male GU: no testicular mass, pain, no lymph nodes swollen, no swelling, no rash.     Objective:  BP 128/82   Pulse 81   Ht 5' 5.5" (1.664 m)   Wt 185 lb 12.8 oz (84.3 kg)   SpO2 96%   BMI 30.45 kg/m   General appearance: alert, no distress, WD/WN, African American male Skin: Unremarkable HEENT: normocephalic, conjunctiva/corneas normal, sclerae anicteric, PERRLA, EOMi, nares patent, no discharge or erythema, pharynx normal Oral cavity: MMM, tongue normal, teeth normal Neck: supple, no lymphadenopathy, no thyromegaly, no masses, normal ROM, no bruits Chest: non tender, normal shape and expansion Heart: RRR, normal S1, S2, no murmurs Lungs: CTA bilaterally, no wheezes, rhonchi, or rales Abdomen: +bs, soft, non tender, non distended, no masses, no hepatomegaly, no splenomegaly, no bruits Back: non tender, normal ROM, no scoliosis Musculoskeletal: upper extremities non tender, no obvious deformity, normal ROM throughout, lower extremities non tender, no obvious deformity, normal ROM throughout Extremities: no edema, no cyanosis, no clubbing Pulses: 2+ symmetric, upper and lower extremities, normal cap refill Neurological: alert, oriented x 3, CN2-12 intact, strength  normal upper extremities and lower extremities, sensation normal throughout, DTRs 2+ throughout, no cerebellar signs, gait  normal Psychiatric: normal affect, behavior normal, pleasant  GU: normal male external genitalia,circumcised, nontender, no masses, no hernia, no lymphadenopathy Rectal: Normal tone, prostate within normal limits  EKG  indication, physical, screen for heart disease, rate 69 bpm, PR interval  200 ms, QRS 92 ms, QTC 398 ms, axis 72 degrees, normal sinus rhythm    Assessment and Plan :   Encounter Diagnoses  Name Primary?  . Encounter for health maintenance examination in adult Yes  . Colon cancer screening   . Screening for prostate cancer   . Screen for STD (sexually transmitted disease)   . Encounter for hepatitis C screening test for low risk patient   . Vaccine counseling   . Screening for heart disease   . Need for Tdap vaccination   . Need for pneumococcal vaccination   . History of empyema of pleura   . Vertigo     Today you had a preventative care visit or wellness visit.    Topics today may have included healthy lifestyle, diet, exercise, preventative care, vaccinations, sick and well care, proper use of emergency dept and after hours care, as well as other concerns.     Recommendations: Continue to return yearly for your annual wellness and preventative care visits.  This gives [place] a chance to discuss healthy lifestyle, exercise, vaccinations, review your chart record, and perform screenings where appropriate.  I recommend you see your eye doctor yearly for routine vision care.  I recommend you see your dentist yearly for routine dental care including hygiene visits twice yearly.   Vaccination recommendations were reviewed  You declined flu and COVID vaccine  Shingles vaccine:  I recommend you have a shingles vaccine to help prevent shingles or herpes zoster outbreak.   Please call your insurer to inquire about coverage for the Shingrix vaccine given in 2 doses.   Some insurers cover this vaccine after age 15, some cover this after age 99.  If your insurer covers  this, then call to schedule appointment to have this vaccine here.  Counseled on the Tdap (tetanus, diptheria, and acellular pertussis) vaccine.  Vaccine information sheet given. Tdap vaccine given after consent obtained.  Counseled on the pneumococcal vaccine.  Vaccine information sheet given.  Pneumococcal vaccine PPSV23 given after consent obtained.    Screening for cancer: Colon cancer screening:  Pending labs will refer to either colonoscopy or Cologuard  We discussed PSA, prostate exam, and prostate cancer screening risks/benefits.     Skin cancer screening: Check your skin regularly for new changes, growing lesions, or other lesions of concern Come in for evaluation if you have skin lesions of concern.  Lung cancer screening: If you have a greater than 30 pack year history of tobacco use, then you qualify for lung cancer screening with a chest CT scan  We currently don't have screenings for other cancers besides breast, cervical, colon, and lung cancers.  If you have a strong family history of cancer or have other cancer screening concerns, please let me know.    Bone health: Get at least 150 minutes of aerobic exercise weekly Get weight bearing exercise at least once weekly   Heart health: Get at least 150 minutes of aerobic exercise weekly Limit alcohol It is important to maintain a healthy blood pressure and healthy cholesterol numbers   EKG screen today   Screening for sexually transmitted infections: We discussed testing,  prevention, and means of transmission    Separate significant issues discussed: Vertigo -begin trial of meclizine.  Discussed proper use of medicine, risk and benefits of medicine.  If not much improved after the next 2 to 3 weeks consider vestibular rehab or ENT consult    Declin was seen today for annual exam.  Diagnoses and all orders for this visit:  Encounter for health maintenance examination in adult -     Comprehensive  metabolic panel -     CBC with Differential/Platelet -     Lipid panel -     HIV Antibody (routine testing w rflx) -     RPR -     GC/Chlamydia Probe Amp -     Hepatitis C antibody -     EKG 12-Lead -     POCT Urinalysis DIP (Proadvantage Device) -     PSA -     Vitamin B12 -     TSH  Colon cancer screening -     Ambulatory referral to Gastroenterology  Screening for prostate cancer -     PSA  Screen for STD (sexually transmitted disease) -     HIV Antibody (routine testing w rflx) -     RPR -     GC/Chlamydia Probe Amp -     Hepatitis C antibody  Encounter for hepatitis C screening test for low risk patient -     Hepatitis C antibody  Vaccine counseling  Screening for heart disease -     EKG 12-Lead  Need for Tdap vaccination  Need for pneumococcal vaccination  History of empyema of pleura  Vertigo -     Comprehensive metabolic panel -     CBC with Differential/Platelet  Other orders -     meclizine (ANTIVERT) 25 MG tablet; Take 1 tablet (25 mg total) by mouth 2 (two) times daily. -     Pneumococcal polysaccharide vaccine 23-valent greater than or equal to 2yo subcutaneous/IM -     Tdap vaccine greater than or equal to 7yo IM   Follow-up pending labs, yearly for physical

## 2020-11-24 LAB — LIPID PANEL
Chol/HDL Ratio: 3.6 ratio (ref 0.0–5.0)
Cholesterol, Total: 304 mg/dL — ABNORMAL HIGH (ref 100–199)
HDL: 84 mg/dL (ref >39)
LDL Chol Calc (NIH): 215 mg/dL — ABNORMAL HIGH (ref 0–99)
Triglycerides: 45 mg/dL (ref 0–149)
VLDL Cholesterol Cal: 5 mg/dL (ref 5–40)

## 2020-11-24 LAB — GC/CHLAMYDIA PROBE AMP
Chlamydia trachomatis, NAA: NEGATIVE
Neisseria Gonorrhoeae by PCR: NEGATIVE

## 2020-11-24 LAB — COMPREHENSIVE METABOLIC PANEL
ALT: 27 IU/L (ref 0–44)
AST: 28 IU/L (ref 0–40)
Albumin: 4.2 g/dL (ref 3.8–4.9)
BUN/Creatinine Ratio: 12 (ref 9–20)
BUN: 13 mg/dL (ref 6–24)
Bilirubin Total: 0.5 mg/dL (ref 0.0–1.2)
CO2: 23 mmol/L (ref 20–29)
Calcium: 9.4 mg/dL (ref 8.7–10.2)
Chloride: 103 mmol/L (ref 96–106)
GFR calc Af Amer: 93 mL/min/{1.73_m2} (ref >59)
GFR calc non Af Amer: 81 mL/min/{1.73_m2} (ref >59)
Globulin, Total: 2.5 g/dL (ref 1.5–4.5)
Glucose: 82 mg/dL (ref 65–99)
Potassium: 4.6 mmol/L (ref 3.5–5.2)
Sodium: 139 mmol/L (ref 134–144)
Total Protein: 6.7 g/dL (ref 6.0–8.5)

## 2020-11-24 LAB — CBC WITH DIFFERENTIAL/PLATELET
Basophils Absolute: 0 10*3/uL (ref 0.0–0.2)
EOS (ABSOLUTE): 0.1 10*3/uL (ref 0.0–0.4)
Eos: 2 %
Hemoglobin: 14.2 g/dL (ref 13.0–17.7)
Immature Grans (Abs): 0 10*3/uL (ref 0.0–0.1)
Immature Granulocytes: 0 %
Lymphocytes Absolute: 2.3 10*3/uL (ref 0.7–3.1)
Lymphs: 55 %
MCH: 30.2 pg (ref 26.6–33.0)
MCV: 89 fL (ref 79–97)
Monocytes Absolute: 0.3 10*3/uL (ref 0.1–0.9)
Monocytes: 7 %
Neutrophils Absolute: 1.4 10*3/uL (ref 1.4–7.0)
Platelets: 244 10*3/uL (ref 150–450)
RBC: 4.7 x10E6/uL (ref 4.14–5.80)
RDW: 12.3 % (ref 11.6–15.4)
WBC: 4 10*3/uL (ref 3.4–10.8)

## 2020-11-24 LAB — PSA: Prostate Specific Ag, Serum: 1 ng/mL (ref 0.0–4.0)

## 2020-11-24 LAB — RPR: RPR Ser Ql: NONREACTIVE

## 2020-11-24 LAB — HIV ANTIBODY (ROUTINE TESTING W REFLEX): HIV Screen 4th Generation wRfx: NONREACTIVE

## 2020-11-24 LAB — HEPATITIS C ANTIBODY: Hep C Virus Ab: <0.1 s/co ratio (ref 0.0–0.9)

## 2020-11-24 LAB — TSH: TSH: 0.975 u[IU]/mL (ref 0.450–4.500)

## 2021-06-16 ENCOUNTER — Telehealth: Payer: Self-pay | Admitting: Internal Medicine

## 2021-06-16 NOTE — Telephone Encounter (Signed)
Back from February 2022 visit. You made a comment about referral to cologuard or colonscopy pending labs. Never specify after labs done. Please advise if I can order one of these

## 2021-06-16 NOTE — Telephone Encounter (Signed)
Left message for pt to call me back 

## 2021-06-17 NOTE — Telephone Encounter (Signed)
Pt does not have any family history but he hasn't made his mind up. He will call back when he decides

## 2021-10-18 ENCOUNTER — Encounter: Payer: Self-pay | Admitting: Internal Medicine

## 2021-10-25 NOTE — Telephone Encounter (Signed)
Pt declines flu shot for now since he doesn't have insurance. If he decides to get this he will go to pharmacy to get it cheaper

## 2022-06-07 ENCOUNTER — Encounter: Payer: Self-pay | Admitting: Internal Medicine

## 2022-07-11 ENCOUNTER — Encounter: Payer: Self-pay | Admitting: Internal Medicine

## 2023-06-20 ENCOUNTER — Encounter (HOSPITAL_COMMUNITY): Payer: Self-pay | Admitting: *Deleted

## 2023-06-20 ENCOUNTER — Other Ambulatory Visit: Payer: Self-pay

## 2023-06-20 ENCOUNTER — Ambulatory Visit (HOSPITAL_COMMUNITY)
Admission: EM | Admit: 2023-06-20 | Discharge: 2023-06-20 | Disposition: A | Discharge status: Home / Self Care | Payer: No Typology Code available for payment source | Attending: Family Medicine | Admitting: Family Medicine

## 2023-06-20 DIAGNOSIS — R350 Frequency of micturition: Secondary | ICD-10-CM | POA: Insufficient documentation

## 2023-06-20 LAB — POCT URINALYSIS DIP (MANUAL ENTRY)
Bilirubin, UA: NEGATIVE
Blood, UA: NEGATIVE
Glucose, UA: NEGATIVE mg/dL
Ketones, POC UA: NEGATIVE mg/dL
Leukocytes, UA: NEGATIVE
Nitrite, UA: NEGATIVE
Protein Ur, POC: NEGATIVE mg/dL
Spec Grav, UA: >=1.03 — AB (ref 1.010–1.025)
Urobilinogen, UA: 1 U/dL
pH, UA: 5.5 (ref 5.0–8.0)

## 2023-06-20 LAB — POCT FASTING CBG KUC MANUAL ENTRY: POCT Glucose (KUC): 100 mg/dL — AB (ref 70–99)

## 2023-06-20 NOTE — Discharge Instructions (Signed)
We have sent testing for sexually transmitted infections. We will notify you of any positive results once they are received. If required, we will prescribe any medications you might need.  Please refrain from all sexual activity for at least the next seven days.

## 2023-06-20 NOTE — ED Triage Notes (Signed)
Pt reports he is up at night to void a lot.Pt also has dysuria

## 2023-06-21 LAB — CYTOLOGY, (ORAL, ANAL, URETHRAL) ANCILLARY ONLY
Chlamydia: NEGATIVE
Comment: NEGATIVE
Comment: NEGATIVE
Comment: NORMAL
Neisseria Gonorrhea: NEGATIVE
Trichomonas: NEGATIVE

## 2023-06-22 ENCOUNTER — Telehealth (HOSPITAL_COMMUNITY): Payer: Self-pay | Admitting: Emergency Medicine

## 2023-06-22 NOTE — Telephone Encounter (Signed)
Pt called request antibiotic from his visit 9/18. Pt test results were negative and told antibiotics were not needed at this time but he could be seen back at urgent care for symptoms.

## 2023-06-26 ENCOUNTER — Ambulatory Visit (HOSPITAL_COMMUNITY)
Admission: EM | Admit: 2023-06-26 | Discharge: 2023-06-26 | Disposition: A | Discharge status: Home / Self Care | Payer: No Typology Code available for payment source | Attending: Emergency Medicine | Admitting: Emergency Medicine

## 2023-06-26 ENCOUNTER — Encounter (HOSPITAL_COMMUNITY): Payer: Self-pay

## 2023-06-26 DIAGNOSIS — R350 Frequency of micturition: Secondary | ICD-10-CM

## 2023-06-26 LAB — POCT URINALYSIS DIP (MANUAL ENTRY)
Bilirubin, UA: NEGATIVE
Blood, UA: NEGATIVE
Glucose, UA: NEGATIVE mg/dL
Ketones, POC UA: NEGATIVE mg/dL
Leukocytes, UA: NEGATIVE
Nitrite, UA: NEGATIVE
Spec Grav, UA: 1.025 (ref 1.010–1.025)
Urobilinogen, UA: 4 E.U./dL — AB
pH, UA: 7.5 (ref 5.0–8.0)

## 2023-06-26 MED ORDER — TAMSULOSIN HCL 0.4 MG PO CAPS: 0.4000 mg | ORAL_CAPSULE | Freq: Every day | ORAL | 0 refills | Status: DC

## 2023-06-26 NOTE — ED Provider Notes (Signed)
[facility]    CSN: [id number] Arrival date & time: 06/26/23  1518      History   Chief Complaint Chief Complaint  Patient presents with   Urinary Frequency    HPI Ian Jenkins is a 52 y.o. male.   Patient presents with continued urinary frequency after evaluation here last week. Denies dysuria, hematuria, flank pain, penile discharge, abdominal pain, and fever.    Urinary Frequency Pertinent negatives include no abdominal pain.    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    Patient Active Problem List   Diagnosis Date Noted   Encounter for health maintenance examination in adult 11/23/2020   Colon cancer screening 11/23/2020   Screening for prostate cancer 11/23/2020   Screen for STD (sexually transmitted disease) 11/23/2020   Encounter for hepatitis C screening test for low risk patient 11/23/2020   Vaccine counseling 11/23/2020   Screening for heart disease 11/23/2020   Need for Tdap vaccination 11/23/2020   Need for pneumococcal vaccination 11/23/2020   History of empyema of pleura 11/23/2020   Vertigo 11/23/2020   EMPYEMA 10/14/2009   DENTAL CARIES 10/14/2009    Past Surgical History:  Procedure Laterality Date   EMPYEMA DRAINAGE  09/25/2009   empyema surgery         Home Medications    Prior to Admission medications   Medication Sig Start Date End Date Taking? Authorizing Provider  tamsulosin (FLOMAX) 0.4 MG CAPS capsule Take 1 capsule (0.4 mg total) by mouth daily. 06/26/23  Yes Letta Kocher, NP    Family History Family History  Problem Relation Age of Onset   Stroke Mother        TIA   Heart failure Father    Heart disease Father        63   Hypertension Father    Alzheimer's disease Maternal Grandmother    Cancer Neg Hx     Social History Social History   Tobacco Use   Smoking status: Never   Smokeless tobacco: Never  Vaping Use   Vaping status: Never Used  Substance Use  Topics   Alcohol use: No   Drug use: No     Allergies   Other   Review of Systems Review of Systems  Gastrointestinal:  Negative for abdominal pain.  Genitourinary:  Positive for frequency. Negative for dysuria, flank pain, hematuria, penile discharge, penile pain, penile swelling, scrotal swelling and testicular pain.     Physical Exam Triage Vital Signs ED Triage Vitals  Encounter Vitals Group     BP 06/26/23 1632 (!) 176/106     Systolic BP Percentile --      Diastolic BP Percentile --      Pulse Rate 06/26/23 1632 (!) 110     Resp 06/26/23 1632 16     Temp 06/26/23 1632 99.2 F (37.3 C)     Temp Source 06/26/23 1632 Oral     SpO2 06/26/23 1632 96 %     Weight 06/26/23 1630 165 lb (74.8 kg)     Height 06/26/23 1630 5\' 7"  (1.702 m)     Head Circumference --      Peak Flow --      Pain Score 06/26/23 1630 0     Pain Loc --      Pain Education --      Exclude from Growth Chart --    No data found.  Updated Vital Signs BP (!) 176/106 (  BP Location: Right Arm)   Pulse (!) 110   Temp 99.2 F (37.3 C) (Oral)   Resp 16   Ht 5\' 7"  (1.702 m)   Wt 165 lb (74.8 kg)   SpO2 96%   BMI 25.84 kg/m   Visual Acuity Right Eye Distance:   Left Eye Distance:   Bilateral Distance:    Right Eye Near:   Left Eye Near:    Bilateral Near:     Physical Exam Vitals and nursing note reviewed.  Constitutional:      General: He is awake. He is not in acute distress.    Appearance: Normal appearance. He is well-developed and well-groomed. He is not ill-appearing, toxic-appearing or diaphoretic.  Abdominal:     General: Abdomen is flat. There is no distension.     Palpations: Abdomen is soft. There is no mass.     Tenderness: There is no abdominal tenderness. There is no right CVA tenderness, left CVA tenderness or guarding.  Genitourinary:    Comments: Exam deferred.  Skin:    General: Skin is warm and dry.  Neurological:     Mental Status: He is alert.  Psychiatric:         Behavior: Behavior is cooperative.      UC Treatments / Results  Labs (all labs ordered are listed, but only abnormal results are displayed) Labs Reviewed  POCT URINALYSIS DIP (MANUAL ENTRY) - Abnormal; Notable for the following components:      Result Value   Clarity, UA cloudy (*)    Protein Ur, POC trace (*)    Urobilinogen, UA 4.0 (*)    All other components within normal limits    EKG   Radiology No results found.  Procedures Procedures (including critical care time)  Medications Ordered in UC Medications - No data to display  Initial Impression / Assessment and Plan / UC Course  I have reviewed the triage vital signs and the nursing notes.  Pertinent labs & imaging results that were available during my care of the patient were reviewed by me and considered in my medical decision making (see chart for details).     Patient presented with continued urinary frequency after evaluation here last week. Denies dysuria, hematuria, flank pain, penile discharge, abdominal pain, and fever. UA was insignificant. No significant findings upon assessment. BP is elevated in clinic. Denies dizziness, headache, chest pain, blurred vision, weakness, numbness, and confusion. Prescribed Tamsulosin. Discussed follow-up, return, and emergency department precautions.  Final Clinical Impressions(s) / UC Diagnoses   Final diagnoses:  Urinary frequency     Discharge Instructions      Take Tamsulosin daily. Please get established with a primary care doctor to manage blood pressure and follow-up with urology regarding continued urinary symptoms.     ED Prescriptions     Medication Sig Dispense Auth. Provider   tamsulosin (FLOMAX) 0.4 MG CAPS capsule Take 1 capsule (0.4 mg total) by mouth daily. 30 capsule Wynonia Lawman A, NP      PDMP not reviewed this encounter.   Wynonia Lawman A, NP 06/26/23 [phone]

## 2023-06-26 NOTE — ED Triage Notes (Signed)
Patient here today with c/o urinary frequency. He was here last week for the same symptoms. Patient is concerned with his prostate.

## 2023-06-26 NOTE — Discharge Instructions (Signed)
Take Tamsulosin daily. Please get established with a primary care doctor to manage blood pressure and follow-up with urology regarding continued urinary symptoms.

## 2023-07-03 NOTE — ED Provider Notes (Signed)
[facility]   [id number] 06/20/23 Arrival Time: 1917  ASSESSMENT & PLAN:  1. Urinary frequency    Rec urology f/u.  Labs Reviewed  POCT URINALYSIS DIP (MANUAL ENTRY) - Abnormal; Notable for the following components:      Result Value   Color, UA orange (*)    Spec Grav, UA >=1.030 (*)    All other components within normal limits  POCT FASTING CBG [facility] MANUAL ENTRY - Abnormal; Notable for the following components:   POCT Glucose (KUC) 100 (*)    All other components within normal limits  CYTOLOGY, (ORAL, ANAL, URETHRAL) ANCILLARY ONLY   Unclear etiology of symptoms. No signs of infection.     Follow-up Information     Schedule an appointment as soon as possible for a visit  with [facility].   Contact information: [address] [place] [address] [place] [address] [phone]                Reviewed expectations re: course of current medical issues. Questions answered. Outlined signs and symptoms indicating need for more acute intervention. Patient verbalized understanding. After Visit Summary given.   SUBJECTIVE: History from: patient. Ian Jenkins is a 52 y.o. male who presents with complaint of freq nocturia; mild dysuria at times. Past few weeks to months. Afebrile. Otherwise well. Denies leaking urine/incontinence   OBJECTIVE:  Vitals:   06/20/23 2009  BP: (!) 142/96  Pulse: 77  Resp: 18  Temp: 98.2 F (36.8 C)  SpO2: 98%    General appearance: alert; no distress Abdomen: soft, non-tender; bowel sounds normal; no masses or organomegaly; no guarding or rebound tenderness Back: no CVA tenderness Extremities: no edema; symmetrical with no gross deformities Skin: warm and dry Psychological: alert and cooperative; normal mood and affect  Labs: Results for orders placed or performed during the hospital encounter of 06/20/23  POC urinalysis dipstick  Result Value Ref Range   Color, UA orange (A) yellow    Clarity, UA clear clear   Glucose, UA negative negative mg/dL   Bilirubin, UA negative negative   Ketones, POC UA negative negative mg/dL   Spec Grav, UA >=4.132 (A) 1.010 - 1.025   Blood, UA negative negative   pH, UA 5.5 5.0 - 8.0   Protein Ur, POC negative negative mg/dL   Urobilinogen, UA 1.0 0.2 or 1.0 E.U./dL   Nitrite, UA Negative Negative   Leukocytes, UA Negative Negative  POC CBG monitoring  Result Value Ref Range   POCT Glucose ([facility]) 100 (A) 70 - 99 mg/dL  Cytology Ancillary Only -  Result Value Ref Range   Neisseria Gonorrhea Negative    Chlamydia Negative    Trichomonas Negative    Comment Normal Reference Range Trichomonas - Negative    Comment Normal Reference Ranger Chlamydia - Negative    Comment      Normal Reference Range Neisseria Gonorrhea - Negative   Labs Reviewed  POCT URINALYSIS DIP (MANUAL ENTRY) - Abnormal; Notable for the following components:      Result Value   Color, UA orange (*)    Spec Grav, UA >=1.030 (*)    All other components within normal limits  POCT FASTING CBG KUC MANUAL ENTRY - Abnormal; Notable for the following components:   POCT Glucose (K[facility]) 100 (*)    All other components within normal limits  CYTOLOGY, (ORAL, ANAL, URETHRAL) ANCILLARY ONLY    Imaging: No results found.  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   Social Determinants of Health   Financial Resource Strain: Not on file  Food Insecurity: Not on file  Transportation Needs: Not on file  Physical Activity: Not on file  Stress: Not on file  Social Connections: Not on file  Intimate Partner Violence: Not on file   Family History  Problem Relation Age of Onset   Stroke Mother        TIA   Heart failure Father    Heart disease Father        52   Hypertension Father    Alzheimer's disease Maternal Grandmother    Cancer Neg Hx    Past Surgical History:  Procedure Laterality Date   EMPYEMA DRAINAGE  09/25/2009   empyema surgery       Mardella Layman, MD 07/03/23 [phone]

## 2023-07-17 ENCOUNTER — Ambulatory Visit (HOSPITAL_COMMUNITY)
Admission: EM | Admit: 2023-07-17 | Discharge: 2023-07-17 | Disposition: A | Discharge status: Home / Self Care | Payer: No Typology Code available for payment source | Attending: Family Medicine | Admitting: Family Medicine

## 2023-07-17 ENCOUNTER — Encounter (HOSPITAL_COMMUNITY): Payer: Self-pay | Admitting: *Deleted

## 2023-07-17 DIAGNOSIS — Z760 Encounter for issue of repeat prescription: Secondary | ICD-10-CM | POA: Diagnosis not present

## 2023-07-17 MED ORDER — TAMSULOSIN HCL 0.4 MG PO CAPS: 0.4000 mg | ORAL_CAPSULE | Freq: Every day | ORAL | 1 refills | Status: DC

## 2023-07-17 NOTE — ED Provider Notes (Signed)
  [facility]   [id number] 07/17/23 Arrival Time: 1312  ASSESSMENT & PLAN:  1. Medication refill    Has upcoming appt with new urologist next month.  Meds ordered this encounter  Medications   tamsulosin (FLOMAX) 0.4 MG CAPS capsule    Sig: Take 1 capsule (0.4 mg total) by mouth daily.    Dispense:  30 capsule    Refill:  1    Reviewed expectations re: course of current medical issues. Questions answered. Outlined signs and symptoms indicating need for more acute intervention. Patient verbalized understanding. After Visit Summary given.   SUBJECTIVE: History from: patient. Ian Jenkins is a 52 y.o. male who presents requesting medication refill. No current concerns. Pt states he needs refill of flomax. [facility] is out of network so he made an appt with someone else. His appt for urology is next month.   OBJECTIVE:  Vitals:   07/17/23 1341  BP: (!) 139/98  Pulse: 88  Resp: 18  Temp: 98.7 F (37.1 C)  TempSrc: Oral  SpO2: 98%    General appearance: alert; no distress Psychological: alert and cooperative; normal mood and affect   Allergies  Allergen Reactions   Other Nausea And Vomiting    Only Scallops    Social History   Socioeconomic History   Marital status: Single    Spouse name: Not on file   Number of children: Not on file   Years of education: Not on file   Highest education level: Not on file  Occupational History   Not on file  Tobacco Use   Smoking status: Never   Smokeless tobacco: Never  Vaping Use   Vaping status: Never Used  Substance and Sexual Activity   Alcohol use: No   Drug use: No   Sexual activity: Not Currently  Other Topics Concern   Not on file  Social History Narrative   Lives with mother and brother, son age 31yo.  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   Social Determinants of Health   Financial Resource Strain: Not on file  Food Insecurity: Not on file  Transportation Needs: Not on file   Physical Activity: Not on file  Stress: Not on file  Social Connections: Not on file  Intimate Partner Violence: Not on file   Family History  Problem Relation Age of Onset   Stroke Mother        TIA   Heart failure Father    Heart disease Father        64   Hypertension Father    Alzheimer's disease Maternal Grandmother    Cancer Neg Hx    Past Surgical History:  Procedure Laterality Date   EMPYEMA DRAINAGE  09/25/2009   empyema surgery        Mardella Layman, MD 07/17/23 1450

## 2023-07-17 NOTE — ED Triage Notes (Signed)
Pt states he needs refill of flomax. [facility] is out of network so he made an appt with someone else. His appt for urology is next month.

## 2023-08-14 ENCOUNTER — Encounter: Payer: Self-pay | Admitting: Family Medicine

## 2023-08-14 ENCOUNTER — Ambulatory Visit (INDEPENDENT_AMBULATORY_CARE_PROVIDER_SITE_OTHER): Payer: No Typology Code available for payment source | Admitting: Family Medicine

## 2023-08-14 VITALS — BP 108/70 | HR 54 | Temp 98.6°F | Ht 66.0 in | Wt 176.9 lb

## 2023-08-14 DIAGNOSIS — R822 Biliuria: Secondary | ICD-10-CM | POA: Insufficient documentation

## 2023-08-14 DIAGNOSIS — Z23 Encounter for immunization: Secondary | ICD-10-CM | POA: Diagnosis not present

## 2023-08-14 DIAGNOSIS — R3914 Feeling of incomplete bladder emptying: Secondary | ICD-10-CM

## 2023-08-14 DIAGNOSIS — N401 Enlarged prostate with lower urinary tract symptoms: Secondary | ICD-10-CM

## 2023-08-14 DIAGNOSIS — Z13220 Encounter for screening for lipoid disorders: Secondary | ICD-10-CM

## 2023-08-14 DIAGNOSIS — Z1211 Encounter for screening for malignant neoplasm of colon: Secondary | ICD-10-CM | POA: Diagnosis not present

## 2023-08-14 LAB — LIPID PANEL
Cholesterol: 328 mg/dL — ABNORMAL HIGH (ref 0–200)
HDL: 86 mg/dL (ref >39.00)
LDL Cholesterol: 227 mg/dL — ABNORMAL HIGH (ref 0–99)
NonHDL: 241.5
Total CHOL/HDL Ratio: 4
Triglycerides: 72 mg/dL (ref 0.0–149.0)
VLDL: 14.4 mg/dL (ref 0.0–40.0)

## 2023-08-14 LAB — CBC WITH DIFFERENTIAL/PLATELET
Basophils Absolute: 0 10*3/uL (ref 0.0–0.1)
Basophils Relative: 0.5 % (ref 0.0–3.0)
Eosinophils Absolute: 0 10*3/uL (ref 0.0–0.7)
Eosinophils Relative: 0.7 % (ref 0.0–5.0)
HCT: 44.7 % (ref 39.0–52.0)
Hemoglobin: 15.3 g/dL (ref 13.0–17.0)
Lymphocytes Relative: 50.1 % — ABNORMAL HIGH (ref 12.0–46.0)
Lymphs Abs: 2.5 10*3/uL (ref 0.7–4.0)
MCHC: 34.3 g/dL (ref 30.0–36.0)
MCV: 91.1 fL (ref 78.0–100.0)
Monocytes Absolute: 0.4 10*3/uL (ref 0.1–1.0)
Monocytes Relative: 8.7 % (ref 3.0–12.0)
Neutro Abs: 2 10*3/uL (ref 1.4–7.7)
Neutrophils Relative %: 40 % — ABNORMAL LOW (ref 43.0–77.0)
Platelets: 256 10*3/uL (ref 150.0–400.0)
RBC: 4.91 Mil/uL (ref 4.22–5.81)
RDW: 12.7 % (ref 11.5–15.5)
WBC: 4.9 10*3/uL (ref 4.0–10.5)

## 2023-08-14 LAB — COMPREHENSIVE METABOLIC PANEL
ALT: 13 U/L (ref 0–53)
AST: 18 U/L (ref 0–37)
Albumin: 4.3 g/dL (ref 3.5–5.2)
Alkaline Phosphatase: 58 U/L (ref 39–117)
BUN: 15 mg/dL (ref 6–23)
CO2: 31 meq/L (ref 19–32)
Calcium: 9.7 mg/dL (ref 8.4–10.5)
Chloride: 101 meq/L (ref 96–112)
Creatinine, Ser: 1.09 mg/dL (ref 0.40–1.50)
GFR: 76.9 mL/min (ref >60.00)
Glucose, Bld: 79 mg/dL (ref 70–99)
Potassium: 4.3 meq/L (ref 3.5–5.1)
Sodium: 139 meq/L (ref 135–145)
Total Bilirubin: 0.9 mg/dL (ref 0.2–1.2)
Total Protein: 7.5 g/dL (ref 6.0–8.3)

## 2023-08-14 MED ORDER — TAMSULOSIN HCL 0.4 MG PO CAPS: 0.4000 mg | ORAL_CAPSULE | Freq: Every day | ORAL | 1 refills | Status: DC

## 2023-08-14 NOTE — Progress Notes (Signed)
New Patient Office Visit  Subjective    Patient ID: Ian Jenkins, male    DOB: 1971/08/01  Age: 52 y.o. MRN: [id number]  CC:  Chief Complaint  Patient presents with   Establish Care    HPI Ian Jenkins presents to establish care Patient has a history of difficulty urinating. States that he went to UC last month due to having trouble, states that he was placed on flomax once daily and he reports this is helping him a lot. He was told that his urine had bilirubin in it and he needed to get it checked. He denies any dysuria, no fever or chills.   I reviewed his past lipid panel from 2022. His LDL was >200 at the time. We discussed that this needed to be followed up on. Pt is in agreement. He denies any chest pain or SOB, does not smoke, he does report his father has high cholesterol.   I have reviewed all aspects of the patient's medical history including social, family, and surgical history.   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    Past Surgical History:  Procedure Laterality Date   EMPYEMA DRAINAGE  09/25/2009   empyema surgery      Family History  Problem Relation Age of Onset   Stroke Mother        TIA   Heart failure Father    Heart disease Father        28   Hypertension Father    Alzheimer's disease Maternal Grandmother    Cancer Neg Hx     Social History   Socioeconomic History   Marital status: Single    Spouse name: Not on file   Number of children: Not on file   Years of education: Not on file   Highest education level: Bachelor's degree (e.g., BA, AB, BS)  Occupational History   Not on file  Tobacco Use   Smoking status: Never   Smokeless tobacco: Never  Vaping Use   Vaping status: Never Used  Substance and Sexual Activity   Alcohol use: Not Currently   Drug use: Never   Sexual activity: Yes  Other  Topics Concern   Not on file  Social History Narrative   Lives with mother and brother, son age 36yo.  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   Social Determinants of Health   Financial Resource Strain: Low Risk  (08/12/2023)   Overall Financial Resource Strain (CARDIA)    Difficulty of Paying Living Expenses: Not very hard  Food Insecurity: No Food Insecurity (08/12/2023)   Hunger Vital Sign    Worried About Running Out of Food in the Last Year: Never true    Ran Out of Food in the Last Year: Never true  Transportation Needs: No Transportation Needs (08/12/2023)   PRAPARE - Administrator, Civil Service (Medical): No    Lack of Transportation (Non-Medical): No  Physical Activity: Insufficiently Active (08/12/2023)   Exercise Vital Sign    Days of Exercise per Week: 2 days    Minutes of Exercise per Session: 40 min  Stress: No Stress Concern Present (08/12/2023)   [employer] of Occupational Health - Occupational Stress Questionnaire    Feeling of Stress : Only a little  Social Connections: Moderately Isolated (08/12/2023)   Social Connection and Isolation Panel [NHANES]  Frequency of Communication with Friends and Family: Three times a week    Frequency of Social Gatherings with Friends and Family: Once a week    Attends Religious Services: 1 to 4 times per year    Active Member of [employer] or Organizations: No    Attends Engineer, structural: Not on file    Marital Status: Divorced  Intimate Partner Violence: Unknown (06/25/2023)   Received from [facility]   HITS    Physically Hurt: Not on file    Insult or Talk Down To: Not on file    Threaten Physical Harm: Not on file    Scream or Curse: Not on file    Review of Systems  All other systems reviewed and are negative.       Objective    BP 108/70 (BP Location: Left Arm, Patient Position: Sitting, Cuff Size: Normal)   Pulse (!) 54   Temp 98.6 F (37 C) (Oral)   Ht 5\' 6"  (1.676 m)    Wt 176 lb 14.4 oz (80.2 kg)   SpO2 98%   BMI 28.55 kg/m   Physical Exam Vitals reviewed.  Constitutional:      Appearance: Normal appearance. He is well-groomed and normal weight.  Eyes:     Extraocular Movements: Extraocular movements intact.     Conjunctiva/sclera: Conjunctivae normal.  Neck:     Thyroid: No thyromegaly.  Cardiovascular:     Rate and Rhythm: Normal rate and regular rhythm.     Heart sounds: S1 normal and S2 normal. No murmur heard. Pulmonary:     Effort: Pulmonary effort is normal.     Breath sounds: Normal breath sounds and air entry. No rales.  Abdominal:     General: Abdomen is flat. Bowel sounds are normal.  Musculoskeletal:     Right lower leg: No edema.     Left lower leg: No edema.  Neurological:     General: No focal deficit present.     Mental Status: He is alert and oriented to person, place, and time.     Gait: Gait is intact.  Psychiatric:        Mood and Affect: Mood and affect normal.         Assessment & Plan:  Need for immunization against influenza -     Flu vaccine trivalent PF, 6mos and older(Flulaval,Afluria,Fluarix,Fluzone)  Benign prostatic hyperplasia with incomplete bladder emptying Assessment & Plan: Currently on flomax 0.4 mg daily, states that this is working well for him, will continue this prescription.   Orders: -     Tamsulosin HCl; Take 1 capsule (0.4 mg total) by mouth daily.  Dispense: 90 capsule; Refill: 1  Bilirubin in urine Assessment & Plan: Can indicate a liver problem, will check CMP today. Pt reports he does not drink.   Orders: -     Comprehensive metabolic panel -     CBC with Differential/Platelet  Need for lipid screening -     Lipid panel  Colon cancer screening -     Cologuard    Return in about 6 months (around 02/11/2024).   Karie Georges, MD

## 2023-08-14 NOTE — Patient Instructions (Signed)
Find some inserts for Plantar Fasciitis to put in your shoes to help with cushioning  Ibuprofen is better than tylenol for this condition

## 2023-08-14 NOTE — Assessment & Plan Note (Signed)
Currently on flomax 0.4 mg daily, states that this is working well for him, will continue this prescription.

## 2023-08-14 NOTE — Assessment & Plan Note (Signed)
Can indicate a liver problem, will check CMP today. Pt reports he does not drink.

## 2023-08-16 ENCOUNTER — Telehealth: Payer: Self-pay | Admitting: Family Medicine

## 2023-08-16 DIAGNOSIS — E782 Mixed hyperlipidemia: Secondary | ICD-10-CM

## 2023-08-16 MED ORDER — ROSUVASTATIN CALCIUM 10 MG PO TABS: 10.0000 mg | ORAL_TABLET | Freq: Every day | ORAL | 1 refills | Status: DC

## 2023-08-16 NOTE — Telephone Encounter (Signed)
Pt is calling and would like to know the side effect of chole med rosuvastatin that md is calling in and also to verify that his liver blood work was done

## 2023-08-16 NOTE — Telephone Encounter (Signed)
Left a detailed message with the information below at the patient's cell number. ?

## 2023-08-16 NOTE — Telephone Encounter (Signed)
Side effects may include muscle aches/cramps, dizziness. Yes his liver tests were normal. I called in the 10 mg daily. Please have him come back to see me in 3 months.

## 2023-08-16 NOTE — Telephone Encounter (Signed)
Pt has additional questions; Regarding generic crestor, will that cause liver damage? Will it affect male ejaculation?

## 2023-08-17 NOTE — Telephone Encounter (Signed)
It does not affect male performance, it has the potential to cause liver issues, however this is why we will be bringing him back to check his liver tests and lipid panel

## 2023-08-17 NOTE — Telephone Encounter (Signed)
Patient informed of the message below and was advised to be fasting for the upcoming appt in February.

## 2023-09-01 LAB — COLOGUARD: COLOGUARD: NEGATIVE

## 2023-09-19 ENCOUNTER — Telehealth: Payer: Self-pay

## 2023-09-19 NOTE — Telephone Encounter (Signed)
Left a detailed message at the patient's cell number stating Dr Casimiro Needle sent a 52-month supply to [facility] previously on 11/12.

## 2023-09-19 NOTE — Telephone Encounter (Signed)
Copied from CRM [id number]. Topic: Clinical - Medication Refill >> Sep 19, 2023  3:13 PM Hector Shade B wrote: Most Recent Primary Care Visit:  Provider: Karie Georges  Department: [facility]  Visit Type: NEW PATIENT  Date: 08/14/2023  Medication: tamsulosin (FLOMAX) 0.4 MG CAPS capsule  Has the patient contacted their pharmacy? No (Agent: If no, request that the patient contact the pharmacy for the refill. If patient does not wish to contact the pharmacy document the reason why and proceed with request.) he was under the impression that if it said no refill on the bottle he would have to call providers office  (Agent: If yes, when and what did the pharmacy advise?)  Is this the correct pharmacy for this prescription? Yes If no, delete pharmacy and type the correct one.  This is the patient's preferred pharmacy:  [id number] - [place], [place] - [address] AT [facility] OF [address] [place] [address] Phone: [phone] Fax: [phone]   Has the prescription been filled recently? Yes  Is the patient out of the medication? Yes  Has the patient been seen for an appointment in the last year OR does the patient have an upcoming appointment? Yes  Can we respond through MyChart? Yes  Agent: Please be advised that Rx refills may take up to 3 business days. We ask that you follow-up with your pharmacy.

## 2023-10-31 NOTE — Telephone Encounter (Unsigned)
Copied from CRM [id number]. Topic: Clinical - Medication Question >> Oct 31, 2023 11:59 AM Leavy Cella D wrote: Reason for CRM: Patient called in stating that he currently on tamsulosin (FLOMAX) 0.4 MG CAPS capsule but has been urinating often because of medication . Patient wants to know if he could get prescription changed to something that doesn't cause him to urinate as much . Please contact patient to advise .

## 2023-11-13 ENCOUNTER — Ambulatory Visit: Payer: No Typology Code available for payment source | Admitting: Family Medicine

## 2023-11-20 ENCOUNTER — Telehealth: Payer: Self-pay

## 2023-11-20 NOTE — Telephone Encounter (Signed)
 Copied from CRM [id number]. Topic: Clinical - Request for Lab/Test Order >> Nov 20, 2023  3:21 PM Tiffany H wrote: Reason for CRM: Patient called to request a physical. Upon further questioning, patient revealed that he'd like to run blood tests to ensure a clean bill of health. Patient has not had a TSH or a Lipid Panel since 2022. Please order full panel of labs for upcoming visit on 11/27/23.

## 2023-11-21 NOTE — Telephone Encounter (Signed)
 Pt just had his cholesterol done in November which was elevated and I had started a medication for this. We will be rechecking his labs at his next visit, but I would also make sure that he is taking the cholesterol medication that I called in for him in November. Thanks!

## 2023-11-22 NOTE — Telephone Encounter (Signed)
 Spoke with the patient and informed him of the message below.  Patient was also advised to be fasting for the upcoming appt on 2/26.

## 2023-11-28 ENCOUNTER — Encounter: Payer: Self-pay | Admitting: Family Medicine

## 2023-11-28 ENCOUNTER — Ambulatory Visit (INDEPENDENT_AMBULATORY_CARE_PROVIDER_SITE_OTHER): Payer: Self-pay | Admitting: Family Medicine

## 2023-11-28 VITALS — BP 130/80 | HR 87 | Temp 98.5°F | Ht 66.0 in | Wt 173.8 lb

## 2023-11-28 DIAGNOSIS — N401 Enlarged prostate with lower urinary tract symptoms: Secondary | ICD-10-CM

## 2023-11-28 DIAGNOSIS — R3914 Feeling of incomplete bladder emptying: Secondary | ICD-10-CM

## 2023-11-28 DIAGNOSIS — E782 Mixed hyperlipidemia: Secondary | ICD-10-CM | POA: Insufficient documentation

## 2023-11-28 LAB — COMPREHENSIVE METABOLIC PANEL
ALT: 18 U/L (ref 0–53)
AST: 17 U/L (ref 0–37)
Albumin: 4.1 g/dL (ref 3.5–5.2)
Alkaline Phosphatase: 59 U/L (ref 39–117)
BUN: 15 mg/dL (ref 6–23)
CO2: 30 meq/L (ref 19–32)
Calcium: 9.1 mg/dL (ref 8.4–10.5)
Chloride: 100 meq/L (ref 96–112)
Creatinine, Ser: 1 mg/dL (ref 0.40–1.50)
GFR: 85.11 mL/min (ref >60.00)
Glucose, Bld: 83 mg/dL (ref 70–99)
Potassium: 4.3 meq/L (ref 3.5–5.1)
Sodium: 137 meq/L (ref 135–145)
Total Bilirubin: 0.7 mg/dL (ref 0.2–1.2)
Total Protein: 7.2 g/dL (ref 6.0–8.3)

## 2023-11-28 LAB — LIPID PANEL
Cholesterol: 206 mg/dL — ABNORMAL HIGH (ref 0–200)
HDL: 89.3 mg/dL (ref >39.00)
LDL Cholesterol: 105 mg/dL — ABNORMAL HIGH (ref 0–99)
NonHDL: 116.26
Total CHOL/HDL Ratio: 2
Triglycerides: 56 mg/dL (ref 0.0–149.0)
VLDL: 11.2 mg/dL (ref 0.0–40.0)

## 2023-11-28 LAB — PSA: PSA: 0.8 ng/mL (ref 0.10–4.00)

## 2023-11-28 NOTE — Progress Notes (Signed)
 Established Patient Office Visit  Subjective   Patient ID: Ian Jenkins, male    DOB: 11-28-1970  Age: 53 y.o. MRN: [id number]  Chief Complaint  Patient presents with   Medical Management of Chronic Issues   Medication Problem    Patient states he discontinued Tamsulosin due to urinary frequency, requests a different medication   Patient requests PSA testing    Also labs for renal function    Pt is here for follow up today on his BPH and HLD.  Pt reports that the tamsulosin did work for him but he reports that he was urinating a lot more and it was difficulty for him at work. States that he stopped taking it about 2 weeks ago, states that after he stopped the medication he is still urinating well, seems that the symptoms resolved.   HLD-- pt reports he is tolerating the rosuvastatin, however he reports that his erections are not as "strong" as we used to be, otherwise no other symptoms or side effects reported.     Current Outpatient Medications  Medication Instructions   rosuvastatin (CRESTOR) 10 mg, Oral, Daily    Patient Active Problem List   Diagnosis Date Noted   Mixed hyperlipidemia 11/28/2023   Bilirubin in urine 08/14/2023   Benign prostatic hyperplasia with incomplete bladder emptying 08/14/2023   Encounter for health maintenance examination in adult 11/23/2020   Colon cancer screening 11/23/2020   Screening for prostate cancer 11/23/2020   Screen for STD (sexually transmitted disease) 11/23/2020   Encounter for hepatitis C screening test for low risk patient 11/23/2020   Vaccine counseling 11/23/2020   Screening for heart disease 11/23/2020   Need for Tdap vaccination 11/23/2020   Need for pneumococcal vaccination 11/23/2020   History of empyema of pleura 11/23/2020   Vertigo 11/23/2020   EMPYEMA 10/14/2009   DENTAL CARIES 10/14/2009      Review of Systems  All other systems reviewed and are negative.     Objective:     BP 130/80   Pulse 87    Temp 98.5 F (36.9 C) (Oral)   Ht 5\' 6"  (1.676 m)   Wt 173 lb 12.8 oz (78.8 kg)   SpO2 99%   BMI 28.05 kg/m    Physical Exam Vitals reviewed.  Constitutional:      Appearance: Normal appearance. He is well-groomed and normal weight.  Cardiovascular:     Rate and Rhythm: Normal rate and regular rhythm.     Heart sounds: S1 normal and S2 normal. No murmur heard. Pulmonary:     Effort: Pulmonary effort is normal.     Breath sounds: Normal breath sounds and air entry. No rales.  Abdominal:     General: Abdomen is flat. Bowel sounds are normal.  Musculoskeletal:     Right lower leg: No edema.     Left lower leg: No edema.  Neurological:     General: No focal deficit present.     Mental Status: He is alert and oriented to person, place, and time.     Gait: Gait is intact.  Psychiatric:        Mood and Affect: Mood and affect normal.      No results found for any visits on 11/28/23.    The ASCVD Risk score (Arnett DK, et al., 2019) failed to calculate for the following reasons:   The valid total cholesterol range is 130 to 320 mg/dL    Assessment & Plan:  Benign prostatic hyperplasia with  incomplete bladder emptying Assessment & Plan: Pt stopped his tamsulosin due to increased urinary frequency, will check new PSA and compare to previous result. Pt reports the hesitancy has resolved at this point.   Orders: -     PSA; Future  Mixed hyperlipidemia Assessment & Plan: On crestor 10 mg daily, tolerating it well, will check new lipid panel and CMP today, will adjust dose if needed  Orders: -     Lipid panel; Future -     Comprehensive metabolic panel; Future     Return in about 6 months (around 05/27/2024) for annual physical exam.    Karie Georges, MD

## 2023-11-28 NOTE — Assessment & Plan Note (Signed)
 On crestor 10 mg daily, tolerating it well, will check new lipid panel and CMP today, will adjust dose if needed

## 2023-11-28 NOTE — Assessment & Plan Note (Signed)
 Pt stopped his tamsulosin due to increased urinary frequency, will check new PSA and compare to previous result. Pt reports the hesitancy has resolved at this point.

## 2023-11-30 ENCOUNTER — Encounter: Payer: Self-pay | Admitting: Family Medicine

## 2024-02-12 ENCOUNTER — Ambulatory Visit: Payer: No Typology Code available for payment source | Admitting: Family Medicine

## 2024-04-17 ENCOUNTER — Other Ambulatory Visit: Payer: Self-pay | Admitting: Family Medicine

## 2024-04-17 DIAGNOSIS — E782 Mixed hyperlipidemia: Secondary | ICD-10-CM

## 2024-04-17 DIAGNOSIS — N401 Enlarged prostate with lower urinary tract symptoms: Secondary | ICD-10-CM

## 2024-04-23 ENCOUNTER — Other Ambulatory Visit: Payer: Self-pay | Admitting: Family Medicine

## 2024-04-23 ENCOUNTER — Telehealth: Payer: Self-pay | Admitting: *Deleted

## 2024-04-23 DIAGNOSIS — E782 Mixed hyperlipidemia: Secondary | ICD-10-CM

## 2024-04-23 DIAGNOSIS — N401 Enlarged prostate with lower urinary tract symptoms: Secondary | ICD-10-CM

## 2024-04-23 MED ORDER — TAMSULOSIN HCL 0.4 MG PO CAPS: 0.4000 mg | ORAL_CAPSULE | Freq: Every day | ORAL | 3 refills | Status: DC

## 2024-04-23 NOTE — Telephone Encounter (Unsigned)
 Copied from CRM [id number]. Topic: Clinical - Medication Question >> Apr 23, 2024  2:18 PM Martinique E wrote: Reason for CRM: Patient called in wanting to get a refill of his Tamsulosin , agent could not locate this medication in order to send refill request. Agent could locate that it looks like this medication was stopped at time of visit on 2/26 due to urinary reasons, but patient would like to be back on it.

## 2024-04-23 NOTE — Telephone Encounter (Unsigned)
 Copied from CRM #[id number]. Topic: Clinical - Medication Refill >> Apr 23, 2024  2:17 PM Martinique E wrote: Medication: rosuvastatin  (CRESTOR ) 10 MG tablet  Has the patient contacted their pharmacy? Yes (Agent: If no, request that the patient contact the pharmacy for the refill. If patient does not wish to contact the pharmacy document the reason why and proceed with request.) (Agent: If yes, when and what did the pharmacy advise?)  This is the patient's preferred pharmacy:  [id number] - [place], [place] - [address] AT [facility] OF [address] [place] [address] Phone: [phone] Fax: [phone]  Is this the correct pharmacy for this prescription? Yes If no, delete pharmacy and type the correct one.   Has the prescription been filled recently? No  Is the patient out of the medication? No, 7 days left.  Has the patient been seen for an appointment in the last year OR does the patient have an upcoming appointment? Yes  Can we respond through MyChart? Yes  Agent: Please be advised that Rx refills may take up to 3 business days. We ask that you follow-up with your pharmacy.

## 2024-04-23 NOTE — Telephone Encounter (Signed)
 Script sent

## 2024-04-24 MED ORDER — ROSUVASTATIN CALCIUM 10 MG PO TABS: 10.0000 mg | ORAL_TABLET | Freq: Every day | ORAL | 1 refills | Status: DC

## 2024-05-27 ENCOUNTER — Encounter: Payer: Self-pay | Admitting: Family Medicine

## 2024-05-27 ENCOUNTER — Ambulatory Visit: Payer: Self-pay

## 2024-05-27 ENCOUNTER — Ambulatory Visit (INDEPENDENT_AMBULATORY_CARE_PROVIDER_SITE_OTHER): Payer: PRIVATE HEALTH INSURANCE | Admitting: Family Medicine

## 2024-05-27 VITALS — BP 132/80 | HR 75 | Temp 98.5°F | Ht 65.75 in | Wt 189.7 lb

## 2024-05-27 DIAGNOSIS — E782 Mixed hyperlipidemia: Secondary | ICD-10-CM | POA: Diagnosis not present

## 2024-05-27 DIAGNOSIS — Z0000 Encounter for general adult medical examination without abnormal findings: Secondary | ICD-10-CM

## 2024-05-27 DIAGNOSIS — R3914 Feeling of incomplete bladder emptying: Secondary | ICD-10-CM | POA: Diagnosis not present

## 2024-05-27 DIAGNOSIS — N401 Enlarged prostate with lower urinary tract symptoms: Secondary | ICD-10-CM

## 2024-05-27 LAB — COMPREHENSIVE METABOLIC PANEL WITH GFR
ALT: 16 U/L (ref 0–53)
AST: 18 U/L (ref 0–37)
Albumin: 4.2 g/dL (ref 3.5–5.2)
Alkaline Phosphatase: 61 U/L (ref 39–117)
BUN: 11 mg/dL (ref 6–23)
CO2: 28 meq/L (ref 19–32)
Calcium: 9.3 mg/dL (ref 8.4–10.5)
Chloride: 103 meq/L (ref 96–112)
Creatinine, Ser: 1.03 mg/dL (ref 0.40–1.50)
GFR: 81.86 mL/min (ref >60.00)
Glucose, Bld: 86 mg/dL (ref 70–99)
Potassium: 4.1 meq/L (ref 3.5–5.1)
Sodium: 140 meq/L (ref 135–145)
Total Bilirubin: 0.6 mg/dL (ref 0.2–1.2)
Total Protein: 7.6 g/dL (ref 6.0–8.3)

## 2024-05-27 LAB — LIPID PANEL
Cholesterol: 211 mg/dL — ABNORMAL HIGH (ref 0–200)
HDL: 85.2 mg/dL (ref >39.00)
LDL Cholesterol: 108 mg/dL — ABNORMAL HIGH (ref 0–99)
NonHDL: 126.07
Total CHOL/HDL Ratio: 2
Triglycerides: 88 mg/dL (ref 0.0–149.0)
VLDL: 17.6 mg/dL (ref 0.0–40.0)

## 2024-05-27 MED ORDER — ROSUVASTATIN CALCIUM 10 MG PO TABS: 10.0000 mg | ORAL_TABLET | Freq: Every day | ORAL | 1 refills | Status: DC

## 2024-05-27 MED ORDER — TAMSULOSIN HCL 0.4 MG PO CAPS: 0.4000 mg | ORAL_CAPSULE | Freq: Every day | ORAL | 1 refills | Status: AC

## 2024-05-27 NOTE — Patient Instructions (Addendum)
 Shingles vaccinations are due  Melatonin 5 mg at bedtime to help with nighttime awakenings  Health Maintenance, Male Adopting a healthy lifestyle and getting preventive care are important in promoting health and wellness. Ask your health care provider about: The right schedule for you to have regular tests and exams. Things you can do on your own to prevent diseases and keep yourself healthy. What should I know about diet, weight, and exercise? Eat a healthy diet  Eat a diet that includes plenty of vegetables, fruits, low-fat dairy products, and lean protein. Do not eat a lot of foods that are high in solid fats, added sugars, or sodium. Maintain a healthy weight Body mass index (BMI) is a measurement that can be used to identify possible weight problems. It estimates body fat based on height and weight. Your health care provider can help determine your BMI and help you achieve or maintain a healthy weight. Get regular exercise Get regular exercise. This is one of the most important things you can do for your health. Most adults should: Exercise for at least 150 minutes each week. The exercise should increase your heart rate and make you sweat (moderate-intensity exercise). Do strengthening exercises at least twice a week. This is in addition to the moderate-intensity exercise. Spend less time sitting. Even light physical activity can be beneficial. Watch cholesterol and blood lipids Have your blood tested for lipids and cholesterol at 55 years of age, then have this test every 5 years. You may need to have your cholesterol levels checked more often if: Your lipid or cholesterol levels are high. You are older than 55 years of age. You are at high risk for heart disease. What should I know about cancer screening? Many types of cancers can be detected early and may often be prevented. Depending on your health history and family history, you may need to have cancer screening at various ages.  This may include screening for: Colorectal cancer. Prostate cancer. Skin cancer. Lung cancer. What should I know about heart disease, diabetes, and high blood pressure? Blood pressure and heart disease High blood pressure causes heart disease and increases the risk of stroke. This is more likely to develop in people who have high blood pressure readings or are overweight. Talk with your health care provider about your target blood pressure readings. Have your blood pressure checked: Every 3-5 years if you are 19-80 years of age. Every year if you are 56 years old or older. If you are between the ages of 12 and 7 and are a current or former smoker, ask your health care provider if you should have a one-time screening for abdominal aortic aneurysm (AAA). Diabetes Have regular diabetes screenings. This checks your fasting blood sugar level. Have the screening done: Once every three years after age 36 if you are at a normal weight and have a low risk for diabetes. More often and at a younger age if you are overweight or have a high risk for diabetes. What should I know about preventing infection? Hepatitis B If you have a higher risk for hepatitis B, you should be screened for this virus. Talk with your health care provider to find out if you are at risk for hepatitis B infection. Hepatitis C Blood testing is recommended for: Everyone born from [birth year] through 1967. Anyone with known risk factors for hepatitis C. Sexually transmitted infections (STIs) You should be screened each year for STIs, including gonorrhea and chlamydia, if: You are sexually active and are  younger than 55 years of age. You are older than 55 years of age and your health care provider tells you that you are at risk for this type of infection. Your sexual activity has changed since you were last screened, and you are at increased risk for chlamydia or gonorrhea. Ask your health care provider if you are at risk. Ask your  health care provider about whether you are at high risk for HIV. Your health care provider may recommend a prescription medicine to help prevent HIV infection. If you choose to take medicine to prevent HIV, you should first get tested for HIV. You should then be tested every 3 months for as long as you are taking the medicine. Follow these instructions at home: Alcohol use Do not drink alcohol if your health care provider tells you not to drink. If you drink alcohol: Limit how much you have to 0-2 drinks a day. Know how much alcohol is in your drink. In the [place] one drink equals one 12 oz bottle of beer (355 mL), one 5 oz glass of wine (148 mL), or one 1 oz glass of hard liquor (44 mL). Lifestyle Do not use any products that contain nicotine or tobacco. These products include cigarettes, chewing tobacco, and vaping devices, such as e-cigarettes. If you need help quitting, ask your health care provider. Do not use street drugs. Do not share needles. Ask your health care provider for help if you need support or information about quitting drugs. General instructions Schedule regular health, dental, and eye exams. Stay current with your vaccines. Tell your health care provider if: You often feel depressed. You have ever been abused or do not feel safe at home. Summary Adopting a healthy lifestyle and getting preventive care are important in promoting health and wellness. Follow your health care provider's instructions about healthy diet, exercising, and getting tested or screened for diseases. Follow your health care provider's instructions on monitoring your cholesterol and blood pressure. This information is not intended to replace advice given to you by your health care provider. Make sure you discuss any questions you have with your health care provider. Document Revised: 02/07/2021 Document Reviewed: 02/07/2021 Elsevier Patient Education  2024 [employer].

## 2024-05-27 NOTE — Telephone Encounter (Signed)
 He didn't mention this to me in the visit-- I would have him wait a couple of days to see if it improves. If not then he will need a new appointment.

## 2024-05-27 NOTE — Telephone Encounter (Signed)
 Now that I remember he did mention it but I advised that it was likely a pulled muscle while in the visit.

## 2024-05-27 NOTE — Telephone Encounter (Signed)
 Spoke with the patient and informed him of the message below.  Patient was also advised PCP stated she told him his symptoms did not seem to be cardiac or related to his heart as this will feel more like a pressure pain that results in tightness of the chest and shortness of breath, which the patient denied.  Patient was informed the front staff will contact him regarding a price for the visit and he may/or may not keep the appt and will decide after this.

## 2024-05-27 NOTE — Telephone Encounter (Signed)
 Patient informed of the message below and per patient's preference, a visit was scheduled on 9/2.  Patient stated he mentioned this to PCP during the visit today as he thought he had muscle pain due to lifting weights.  Patient asked if he could have been seen for this problem today and was advised he could have mentioned this for evaluation during the visit and instead of not being charged for the CPE appt in general he would have been charged or had to pay his copay. Patient stated he was charged for the physical today and I asked him if he still had [employer] on file and he stated he does not-is now self pay.  Patient was advised this is the reason he was charged and will be charged for another visit next week.  Message sent to PCP and manager at front desk.

## 2024-05-27 NOTE — Telephone Encounter (Signed)
 FYI Only or Action Required?: Action required by provider: clinical question for provider and update on patient condition.  Patient was last seen in primary care on 05/27/2024 by Ozell Heron HERO, MD.  Called Nurse Triage reporting Muscle Pain.  Symptoms began several weeks ago.  Interventions attempted: Nothing.  Symptoms are: unchanged.  Triage Disposition: See Physician Within 24 Hours  Patient/caregiver understands and will follow disposition?: No, wishes to speak with PCP   Copied from CRM #[id number]. Topic: Clinical - Red Word Triage >> May 27, 2024 11:34 AM Suzen RAMAN wrote: Red Word that prompted transfer to Nurse Triage: pulled muscle while working out.. pain on left side of chest when moving arm under left nipple. Reason for Disposition  [1] Chest pain lasts < 5 minutes AND [2] NO chest pain or cardiac symptoms (e.g., breathing difficulty, sweating) now  (Exception: Chest pains that last only a few seconds.)  Answer Assessment - Initial Assessment Questions Additional info: Had physical today. He forgot to mention to pcp he has a concern for chest pain. Left sided just below nipple for 1.5 weeks intermittently lasting few second, no other associated symptoms. Feels like a pulled muscle but he wants to rule out cardiac involvement. Currently no pain. Please follow up and advise.    1. LOCATION: Where does it hurt?       Left sided chest just below nipple 2. RADIATION: Does the pain go anywhere else? (e.g., into neck, jaw, arms, back)     Denies 3. ONSET: When did the chest pain begin? (Minutes, hours or days)       1 1/2 weeks ago-felt like pulled muscle 4. PATTERN: Does the pain come and go, or has it been constant since it started?  Does it get worse with exertion?      Intermittent 5. DURATION: How long does it last (e.g., seconds, minutes, hours)     Few seconds  6. SEVERITY: How bad is the pain?  (e.g., Scale 1-10; mild, moderate, or severe)      tender 7. CARDIAC RISK FACTORS: Do you have any history of heart problems or risk factors for heart disease? (e.g., angina, prior heart attack; diabetes, high blood pressure, high cholesterol, smoker, or strong family history of heart disease)      8. PULMONARY RISK FACTORS: Do you have any history of lung disease?  (e.g., blood clots in lung, asthma, emphysema, birth control pills)      9. CAUSE: What do you think is causing the chest pain?     Unsure possible pulled muscle. Pain when moving arm and twisting torso.  10. OTHER SYMPTOMS: Do you have any other symptoms? (e.g., dizziness, nausea, vomiting, sweating, fever, difficulty breathing, cough)       Two weeks ago had shortness of breath but none since  Protocols used: Chest Pain-A-AH

## 2024-05-27 NOTE — Progress Notes (Signed)
 Complete physical exam  Patient: Ian Jenkins   DOB: 30-Dec-1970   53 y.o. Male  MRN: [id number]  Subjective:    Chief Complaint  Patient presents with   Annual Exam    Ian Jenkins is a 53 y.o. male who presents today for a complete physical exam. He reports consuming a general diet. Occasional fast food, eats, doesn't eat as any veggies as he should.  Home exercise routine includes walking 4 hrs per week. Gym/ health club routine includes mod to heavy weightlifting. He generally feels well. He reports sleeping fairly well, is having some nighttime awakenings around 12-1 am, watches tv until he falls back asleep. He does not have additional problems to discuss today.   States that he is having more right thigh pain at night for about the last week. 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 Most recent fall risk assessment:     No data to display           Most recent depression screenings:    08/14/2023   10:14 AM 11/23/2020    9:24 AM  PHQ 2/9 Scores  PHQ - 2 Score 0 0  PHQ- 9 Score 1     Vision:Not within last year, pt does wear glasses Dental: No current dental problems, does not have a regular dentist, last dental visit was 10 years ago, no tooth pain or bleeding gums  Patient Active Problem List   Diagnosis Date Noted   Mixed hyperlipidemia 11/28/2023   Bilirubin in urine 08/14/2023   Benign prostatic hyperplasia with incomplete bladder emptying 08/14/2023   Encounter for health maintenance examination in adult 11/23/2020   Colon cancer screening 11/23/2020   Screening for prostate cancer 11/23/2020   Screen for STD (sexually transmitted disease) 11/23/2020   Encounter for hepatitis C screening test for low risk patient 11/23/2020   Vaccine counseling 11/23/2020   Screening for heart disease 11/23/2020   Need for Tdap vaccination 11/23/2020   Need for pneumococcal vaccination 11/23/2020   History of empyema of pleura 11/23/2020   Vertigo  11/23/2020   EMPYEMA 10/14/2009   DENTAL CARIES 10/14/2009      Patient Care Team: Ozell Heron HERO, MD as PCP - General (Family Medicine)   Outpatient Medications Prior to Visit  Medication Sig   Acetaminophen (TYLENOL PO) Take by mouth as needed.   Docusate Calcium  (STOOL SOFTENER PO) Take by mouth daily as needed.   Fexofenadine-Pseudoephedrine (ALLEGRA-D PO) Take by mouth daily.   Lycopene 10 MG CAPS Take by mouth daily.   [DISCONTINUED] rosuvastatin  (CRESTOR ) 10 MG tablet Take 1 tablet (10 mg total) by mouth daily.   [DISCONTINUED] tamsulosin  (FLOMAX ) 0.4 MG CAPS capsule Take 1 capsule (0.4 mg total) by mouth daily.   No facility-administered medications prior to visit.    Review of Systems  HENT:  Negative for hearing loss.   Eyes:  Negative for blurred vision.  Respiratory:  Negative for shortness of breath.   Cardiovascular:  Negative for chest pain.  Gastrointestinal: Negative.   Genitourinary: Negative.   Musculoskeletal:  Negative for back pain.  Neurological:  Negative for headaches.  Psychiatric/Behavioral:  Negative for depression.        Objective:     BP 132/80   Pulse 75   Temp 98.5 F (36.9 C) (Oral)   Ht 5' 5.75 (1.67 m)   Wt 189 lb 11.2 oz (86 kg)   SpO2 98%   BMI 30.85 kg/m  Physical Exam Vitals reviewed.  Constitutional:      Appearance: Normal appearance. He is well-groomed and normal weight.  HENT:     Right Ear: Tympanic membrane and ear canal normal.     Left Ear: Tympanic membrane and ear canal normal.     Mouth/Throat:     Mouth: Mucous membranes are moist.     Pharynx: No posterior oropharyngeal erythema.  Eyes:     Extraocular Movements: Extraocular movements intact.     Conjunctiva/sclera: Conjunctivae normal.  Neck:     Thyroid: No thyromegaly.  Cardiovascular:     Rate and Rhythm: Normal rate and regular rhythm.     Heart sounds: S1 normal and S2 normal. Murmur (LUSB 2/6 SEM) heard.  Pulmonary:     Effort:  Pulmonary effort is normal.     Breath sounds: Normal breath sounds and air entry. No rales.  Abdominal:     General: Abdomen is flat. Bowel sounds are normal.  Musculoskeletal:     Right lower leg: No edema.     Left lower leg: No edema.  Lymphadenopathy:     Cervical: No cervical adenopathy.  Neurological:     General: No focal deficit present.     Mental Status: He is alert and oriented to person, place, and time.     Gait: Gait is intact.  Psychiatric:        Mood and Affect: Mood and affect normal.      No results found for any visits on 05/27/24.     Assessment & Plan:    Routine Health Maintenance and Physical Exam  Immunization History  Administered Date(s) Administered   Influenza, Seasonal, Injecte, Preservative Fre 08/14/2023   Pneumococcal Polysaccharide-23 11/23/2020   Tdap 11/23/2020    Health Maintenance  Topic Date Due   Hepatitis B Vaccines 19-59 Average Risk (1 of 3 - 19+ 3-dose series) Never done   Zoster Vaccines- Shingrix (1 of 2) Never done   Pneumococcal Vaccine: 50+ Years (2 of 2 - PCV) 11/23/2021   COVID-19 Vaccine (1 - 2024-25 season) Never done   INFLUENZA VACCINE  05/02/2024   Fecal DNA (Cologuard)  08/23/2026   DTaP/Tdap/Td (2 - Td or Tdap) 11/23/2030   Hepatitis C Screening  Completed   HIV Screening  Completed   HPV VACCINES  Aged Out   Meningococcal B Vaccine  Aged Out    Discussed health benefits of physical activity, and encouraged him to engage in regular exercise appropriate for his age and condition.  Routine general medical examination at a health care facility  Mixed hyperlipidemia -     Lipid panel; Future -     Rosuvastatin  Calcium ; Take 1 tablet (10 mg total) by mouth daily.  Dispense: 90 tablet; Refill: 1 -     Comprehensive metabolic panel with GFR; Future  Benign prostatic hyperplasia with incomplete bladder emptying -     Tamsulosin  HCl; Take 1 capsule (0.4 mg total) by mouth daily.  Dispense: 90 capsule; Refill:  1  Normal physical exam findings except for a small murmur that was heard on exam today, ROS is negative so it is likely an innocent murmur. I counseled the patient on the recommended amount of exercise per CDC recommendation. I reviewed preventative screening, immunizations, and medical history and updated in the chart, and appropriate labs and vaccinations were ordered. Handouts given on healthy eating and exercise.    Return in 1 year (on 05/27/2025).     Heron CHRISTELLA Sharper, MD

## 2024-05-27 NOTE — Telephone Encounter (Signed)
 Noted

## 2024-05-28 ENCOUNTER — Ambulatory Visit: Payer: Self-pay | Admitting: Family Medicine

## 2024-05-29 ENCOUNTER — Emergency Department (HOSPITAL_COMMUNITY)
Admission: EM | Admit: 2024-05-29 | Discharge: 2024-05-29 | Disposition: A | Discharge status: Home / Self Care | Payer: Self-pay | Attending: Emergency Medicine | Admitting: Emergency Medicine

## 2024-05-29 ENCOUNTER — Other Ambulatory Visit: Payer: Self-pay

## 2024-05-29 ENCOUNTER — Encounter (HOSPITAL_COMMUNITY): Payer: Self-pay

## 2024-05-29 ENCOUNTER — Emergency Department (HOSPITAL_COMMUNITY): Payer: Self-pay

## 2024-05-29 DIAGNOSIS — R079 Chest pain, unspecified: Secondary | ICD-10-CM

## 2024-05-29 DIAGNOSIS — Z7982 Long term (current) use of aspirin: Secondary | ICD-10-CM | POA: Insufficient documentation

## 2024-05-29 DIAGNOSIS — I10 Essential (primary) hypertension: Secondary | ICD-10-CM | POA: Insufficient documentation

## 2024-05-29 DIAGNOSIS — R0789 Other chest pain: Secondary | ICD-10-CM | POA: Insufficient documentation

## 2024-05-29 DIAGNOSIS — Z79899 Other long term (current) drug therapy: Secondary | ICD-10-CM | POA: Insufficient documentation

## 2024-05-29 DIAGNOSIS — R0602 Shortness of breath: Secondary | ICD-10-CM | POA: Insufficient documentation

## 2024-05-29 LAB — CBC
HCT: 43 % (ref 39.0–52.0)
Hemoglobin: 14.8 g/dL (ref 13.0–17.0)
MCH: 30.6 pg (ref 26.0–34.0)
MCHC: 34.4 g/dL (ref 30.0–36.0)
MCV: 88.8 fL (ref 80.0–100.0)
Platelets: 237 K/uL (ref 150–400)
RBC: 4.84 MIL/uL (ref 4.22–5.81)
RDW: 12.1 % (ref 11.5–15.5)
WBC: 4.8 K/uL (ref 4.0–10.5)
nRBC: 0 % (ref 0.0–0.2)

## 2024-05-29 LAB — BASIC METABOLIC PANEL WITH GFR
Anion gap: 7 (ref 5–15)
BUN: 9 mg/dL (ref 6–20)
CO2: 26 mmol/L (ref 22–32)
Calcium: 9.3 mg/dL (ref 8.9–10.3)
Chloride: 106 mmol/L (ref 98–111)
Creatinine, Ser: 1.1 mg/dL (ref 0.61–1.24)
GFR, Estimated: >60 mL/min (ref >60)
Glucose, Bld: 98 mg/dL (ref 70–99)
Potassium: 4 mmol/L (ref 3.5–5.1)
Sodium: 139 mmol/L (ref 135–145)

## 2024-05-29 LAB — TROPONIN I (HIGH SENSITIVITY)
Troponin I (High Sensitivity): 4 ng/L (ref <18)
Troponin I (High Sensitivity): 5 ng/L (ref <18)

## 2024-05-29 NOTE — ED Notes (Signed)
Called to be roomed no answer

## 2024-05-29 NOTE — ED Provider Notes (Signed)
 [facility] Provider Note   CSN: [id number] Arrival date & time: 05/29/24  1229     Patient presents with: Shortness of Breath and Chest Pain   Ian Jenkins is a 53 y.o. male.  With a history of empyema (2010) who presents to ED for chest pain shortness of breath.  Patient got up out of bed to urinate between 0 500 and 0 600 this morning.  Shortly thereafter began develop left-sided chest pain with shortness of breath.  This lasted for 5 minutes before resolving spontaneously.  He did take 81 mg aspirin prior to arrival.  Is asymptomatic now but wanted to come to the ED today for evaluation after experiencing the chest pain.  No fevers chills nausea vomiting current chest pain, current shortness of breath or recent illness.  He did recently have some chest discomfort after doing push-ups but he has not been doing push-ups recently    Shortness of Breath Associated symptoms: chest pain   Chest Pain Associated symptoms: shortness of breath        Prior to Admission medications   Medication Sig Start Date End Date Taking? Authorizing Provider  Acetaminophen (TYLENOL PO) Take by mouth as needed.    [provider]  Docusate Calcium  (STOOL SOFTENER PO) Take by mouth daily as needed.    [provider]  Fexofenadine-Pseudoephedrine (ALLEGRA-D PO) Take by mouth daily.    [provider]  Lycopene 10 MG CAPS Take by mouth daily.    [provider]  rosuvastatin  (CRESTOR ) 10 MG tablet Take 1 tablet (10 mg total) by mouth daily. 05/27/24   Ozell Heron HERO, MD  tamsulosin  (FLOMAX ) 0.4 MG CAPS capsule Take 1 capsule (0.4 mg total) by mouth daily. 05/27/24   Ozell Heron HERO, MD    Allergies: Other    Review of Systems  Respiratory:  Positive for shortness of breath.   Cardiovascular:  Positive for chest pain.    Updated Vital Signs BP (!) 118/95   Pulse 83   Temp 98.2 F (36.8 C) (Oral)   Resp 17    Ht 5' 5 (1.651 m)   Wt 83.9 kg   SpO2 100%   BMI 30.79 kg/m   Physical Exam Vitals and nursing note reviewed.  HENT:     Head: Normocephalic and atraumatic.  Eyes:     Pupils: Pupils are equal, round, and reactive to light.  Cardiovascular:     Rate and Rhythm: Normal rate and regular rhythm.  Pulmonary:     Effort: Pulmonary effort is normal.     Breath sounds: Normal breath sounds.  Chest:     Chest wall: No tenderness.  Abdominal:     Palpations: Abdomen is soft.     Tenderness: There is no abdominal tenderness.  Musculoskeletal:     Right lower leg: No edema.     Left lower leg: No edema.  Skin:    General: Skin is warm and dry.  Neurological:     Mental Status: He is alert.  Psychiatric:        Mood and Affect: Mood normal.     (all labs ordered are listed, but only abnormal results are displayed) Labs Reviewed  BASIC METABOLIC PANEL WITH GFR  CBC  TROPONIN I (HIGH SENSITIVITY)  TROPONIN I (HIGH SENSITIVITY)    EKG: EKG Interpretation Date/Time:  Thursday May 29 2024 12:55:16 EDT Ventricular Rate:  83 PR Interval:  178 QRS Duration:  78  QT Interval:  358 QTC Calculation: 420 R Axis:   86  Text Interpretation: Normal sinus rhythm Normal ECG When compared with ECG of 23-Aug-2014 10:47, PREVIOUS ECG IS PRESENT Confirmed by Pamella Sharper [phone]) on 05/29/2024 3:40:39 PM  Radiology: [facility] Chest 2 View Result Date: 05/29/2024 CLINICAL DATA:  Chest pain EXAM: CHEST - 2 VIEW COMPARISON:  08/23/2014 FINDINGS: The heart size and mediastinal contours are within normal limits. Both lungs are clear. The visualized skeletal structures are unremarkable. IMPRESSION: No active cardiopulmonary disease. Electronically Signed   By: Franky Crease M.D.   On: 05/29/2024 13:41     Procedures   Medications Ordered in the ED - No data to display  Clinical Course as of 05/29/24 1728  Thu May 29, 2024  1726 Initial laboratory workup unremarkable.  Chest x-ray looks clear.   EKG without ischemic changes or dysrhythmia.  Delta troponin is flat.  Low special for ACS.  Unclear cause of chest pain.  Appropriate for PCP follow-up. [MP]    Clinical Course User Index [MP] Pamella Sharper LABOR, DO                                 Medical Decision Making 53 year old male with history as above presented to the ED for transient episode of chest pain shortness of breath earlier this morning.  Resolved after few minutes.  Is comfortable appearing on room air.  Asymptomatic.  Slightly hypertensive.  Benign physical exam without adventitious lung sounds or appreciable murmur.  Will obtain cardiac workup including high-sensitivity troponin EKG labs and x-ray.  Unclear cause of patient's chest pain but suspect if his labs chest x-ray and EKG look okay and he remained stable he can go home with PCP follow-up.  Low suspicion for pathology such as pulmonary embolism, acute aortic dissection or recurrent empyema.  Amount and/or Complexity of Data Reviewed Labs: ordered. Radiology: ordered.        Final diagnoses:  Chest pain, unspecified type    ED Discharge Orders     None          Pamella Sharper LABOR, DO 05/29/24 1728

## 2024-05-29 NOTE — ED Triage Notes (Signed)
 C/O chest pressure/shob/ anxiety this morning around 0500. No hx of anxiety. Denies n/v.

## 2024-05-29 NOTE — Discharge Instructions (Signed)
 He was seen in the emergency room for chest pain Your blood work EKG and chest x-ray all looked okay We are not certain what is causing your chest pain but there is no evidence of heart attack today Return to the emergency room for chest pain trouble breathing or other concerns Continue taking all previous prescribed indications Follow-up with your primary care doctor within 1 week for reevaluation

## 2024-06-03 ENCOUNTER — Ambulatory Visit: Admitting: Family Medicine

## 2024-09-10 ENCOUNTER — Encounter (HOSPITAL_COMMUNITY): Payer: Self-pay

## 2024-09-10 ENCOUNTER — Ambulatory Visit (HOSPITAL_COMMUNITY)
Admission: EM | Admit: 2024-09-10 | Discharge: 2024-09-10 | Disposition: A | Discharge status: Home / Self Care | Payer: Self-pay | Attending: Nurse Practitioner | Admitting: Nurse Practitioner

## 2024-09-10 DIAGNOSIS — R61 Generalized hyperhidrosis: Secondary | ICD-10-CM

## 2024-09-10 DIAGNOSIS — B349 Viral infection, unspecified: Secondary | ICD-10-CM

## 2024-09-10 DIAGNOSIS — J3489 Other specified disorders of nose and nasal sinuses: Secondary | ICD-10-CM

## 2024-09-10 LAB — POC COVID19/FLU A&B COMBO
Covid Antigen, POC: NEGATIVE
Influenza A Antigen, POC: NEGATIVE
Influenza B Antigen, POC: NEGATIVE

## 2024-09-10 NOTE — ED Triage Notes (Signed)
 Pt c/o dry nose and lips x2 days. States his face feels dehydrated. States has been sweating at night x2. Denies taken any meds.

## 2024-09-10 NOTE — ED Provider Notes (Signed)
 [facility]    CSN: [id number] Arrival date & time: 09/10/24  1157      History   Chief Complaint Chief Complaint  Patient presents with   Nasal Congestion    HPI Ian Jenkins is a 53 y.o. male.   Patient presents today with 2-day history of dry nose, dry lips, dry face and sweating at nighttime.  He denies fever, body aches or chills, cough, shortness of breath or chest pain, runny nose, sore throat, headache, ear pain, abdominal pain, nausea/vomiting, and diarrhea.  He is having some left-sided sinus pressure, has had some green nasal mucus at nighttime, and decreased appetite.  Has not take anything for symptoms so far.  Has been increasing water intake without improvement.  Wonders if he may have a sinus infection.    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    Patient Active Problem List   Diagnosis Date Noted   Mixed hyperlipidemia 11/28/2023   Bilirubin in urine 08/14/2023   Benign prostatic hyperplasia with incomplete bladder emptying 08/14/2023   Encounter for health maintenance examination in adult 11/23/2020   Colon cancer screening 11/23/2020   Screening for prostate cancer 11/23/2020   Screen for STD (sexually transmitted disease) 11/23/2020   Encounter for hepatitis C screening test for low risk patient 11/23/2020   Vaccine counseling 11/23/2020   Screening for heart disease 11/23/2020   Need for Tdap vaccination 11/23/2020   Need for pneumococcal vaccination 11/23/2020   History of empyema of pleura 11/23/2020   Vertigo 11/23/2020   EMPYEMA 10/14/2009   DENTAL CARIES 10/14/2009    Past Surgical History:  Procedure Laterality Date   EMPYEMA DRAINAGE  09/25/2009   empyema surgery         Home Medications    Prior to Admission medications   Medication Sig Start Date End Date Taking? Authorizing Provider  Acetaminophen (TYLENOL PO) Take by mouth as needed.    [provider]  Docusate Calcium   (STOOL SOFTENER PO) Take by mouth daily as needed.    [provider]  Fexofenadine-Pseudoephedrine (ALLEGRA-D PO) Take by mouth daily.    [provider]  Lycopene 10 MG CAPS Take by mouth daily.    [provider]  rosuvastatin  (CRESTOR ) 10 MG tablet Take 1 tablet (10 mg total) by mouth daily. 05/27/24   Ozell Heron HERO, MD  tamsulosin  (FLOMAX ) 0.4 MG CAPS capsule Take 1 capsule (0.4 mg total) by mouth daily. 05/27/24   Ozell Heron HERO, MD    Family History Family History  Problem Relation Age of Onset   Stroke Mother        TIA   Heart failure Father    Heart disease Father        6   Hypertension Father    Alzheimer's disease Maternal Grandmother    Cancer Neg Hx     Social History Social History   Tobacco Use   Smoking status: Never   Smokeless tobacco: Never  Vaping Use   Vaping status: Never Used  Substance Use Topics   Alcohol use: Not Currently   Drug use: Never     Allergies   Other   Review of Systems Review of Systems Per HPI  Physical Exam Triage Vital Signs ED Triage Vitals  Encounter Vitals Group     BP 09/10/24 1357 (!) 154/94     Girls Systolic BP Percentile --      Girls Diastolic BP Percentile --  Boys Systolic BP Percentile --      Boys Diastolic BP Percentile --      Pulse Rate 09/10/24 1357 97     Resp 09/10/24 1357 18     Temp 09/10/24 1357 98.8 F (37.1 C)     Temp Source 09/10/24 1357 Oral     SpO2 09/10/24 1357 98 %     Weight --      Height --      Head Circumference --      Peak Flow --      Pain Score 09/10/24 1356 0     Pain Loc --      Pain Education --      Exclude from Growth Chart --    No data found.  Updated Vital Signs BP (!) 154/94 (BP Location: Right Arm)   Pulse 97   Temp 98.8 F (37.1 C) (Oral)   Resp 18   SpO2 98%   Visual Acuity Right Eye Distance:   Left Eye Distance:   Bilateral Distance:    Right Eye Near:   Left Eye Near:    Bilateral Near:      Physical Exam Vitals and nursing note reviewed.  Constitutional:      General: He is not in acute distress.    Appearance: Normal appearance. He is not ill-appearing or toxic-appearing.  HENT:     Head: Normocephalic and atraumatic.     Right Ear: Tympanic membrane, ear canal and external ear normal.     Left Ear: Tympanic membrane, ear canal and external ear normal.     Nose: No congestion or rhinorrhea.     Mouth/Throat:     Mouth: Mucous membranes are moist.     Pharynx: Oropharynx is clear. No oropharyngeal exudate or posterior oropharyngeal erythema.  Eyes:     General: No scleral icterus.    Extraocular Movements: Extraocular movements intact.  Cardiovascular:     Rate and Rhythm: Normal rate and regular rhythm.  Pulmonary:     Effort: Pulmonary effort is normal. No respiratory distress.     Breath sounds: Normal breath sounds. No wheezing, rhonchi or rales.  Musculoskeletal:     Cervical back: Normal range of motion and neck supple.  Lymphadenopathy:     Cervical: No cervical adenopathy.  Skin:    General: Skin is warm and dry.     Coloration: Skin is not jaundiced or pale.     Findings: No erythema or rash.  Neurological:     Mental Status: He is alert and oriented to person, place, and time.  Psychiatric:        Behavior: Behavior is cooperative.      UC Treatments / Results  Labs (all labs ordered are listed, but only abnormal results are displayed) Labs Reviewed  POC COVID19/FLU A&B COMBO    EKG   Radiology No results found.  Procedures Procedures (including critical care time)  Medications Ordered in UC Medications - No data to display  Initial Impression / Assessment and Plan / UC Course  I have reviewed the triage vital signs and the nursing notes.  Pertinent labs & imaging results that were available during my care of the patient were reviewed by me and considered in my medical decision making (see chart for details).   Patient is a very  pleasant, well-appearing 53 year old African-American male presenting today for concerns for dehydration and possible sinus infection.  Examination is reassuring and vital signs are stable today.  Suspect symptoms  due to dry air given recent colder temperature versus mild viral infection.  COVID-19 influenza testing is negative today.  Supportive care discussed with patient including increase fluids, start humidifier, nasal saline as needed.  Return and ER precautions discussed.  The patient was given the opportunity to ask questions.  All questions answered to their satisfaction.  The patient is in agreement to this plan.   Final Clinical Impressions(s) / UC Diagnoses   Final diagnoses:  Night sweats  Nasal dryness  Viral illness     Discharge Instructions      You have a viral upper respiratory infection.  Symptoms should improve over the next week to 10 days.  If you develop chest pain or shortness of breath, go to the emergency room.  COVID 19 and influenza testing is negative today.  Some things that can make you feel better are: - Increased rest - Increasing fluid with water/sugar free electrolytes - Acetaminophen and ibuprofen  as needed for fever/pain - Salt water gargling, chloraseptic spray and throat lozenges - OTC guaifenesin (Mucinex) 600 mg twice daily for congestion - Saline sinus flushes or a neti pot - Humidifying the air     ED Prescriptions   None    PDMP not reviewed this encounter.   Chandra Harlene LABOR, NP 09/10/24 [phone]

## 2024-09-10 NOTE — Discharge Instructions (Signed)
 You have a viral upper respiratory infection.  Symptoms should improve over the next week to 10 days.  If you develop chest pain or shortness of breath, go to the emergency room.  COVID-19 and influenza testing is negative today.    Some things that can make you feel better are: - Increased rest - Increasing fluid with water/sugar free electrolytes - Acetaminophen  and ibuprofen as needed for fever/pain - Salt water gargling, chloraseptic spray and throat lozenges - OTC guaifenesin  (Mucinex ) 600 mg twice daily for congestion - Saline sinus flushes or a neti pot - Humidifying the air

## 2024-11-06 ENCOUNTER — Other Ambulatory Visit: Payer: Self-pay | Admitting: Family Medicine

## 2024-11-06 DIAGNOSIS — E782 Mixed hyperlipidemia: Secondary | ICD-10-CM

## 2025-01-06 ENCOUNTER — Encounter: Payer: Self-pay | Admitting: Family Medicine
